# Patient Record
Sex: Male | Born: 1990
Health system: Southern US, Community
[De-identification: ages and names within clinical notes are randomized; demographics above are authoritative.]

## PROBLEM LIST (undated history)

## (undated) DIAGNOSIS — I10 Essential (primary) hypertension: Secondary | ICD-10-CM

## (undated) DIAGNOSIS — J45909 Unspecified asthma, uncomplicated: Secondary | ICD-10-CM

## (undated) DIAGNOSIS — G43909 Migraine, unspecified, not intractable, without status migrainosus: Secondary | ICD-10-CM

## (undated) HISTORY — DX: Migraine, unspecified, not intractable, without status migrainosus: G43.909

## (undated) HISTORY — DX: Unspecified asthma, uncomplicated: J45.909

## (undated) HISTORY — PX: NO PAST SURGERIES: SHX2092

---

## 2018-02-09 ENCOUNTER — Telehealth: Payer: Self-pay

## 2018-02-09 ENCOUNTER — Encounter (INDEPENDENT_AMBULATORY_CARE_PROVIDER_SITE_OTHER): Payer: Self-pay

## 2018-02-09 ENCOUNTER — Ambulatory Visit (INDEPENDENT_AMBULATORY_CARE_PROVIDER_SITE_OTHER): Payer: 59 | Admitting: Cardiology

## 2018-02-09 ENCOUNTER — Encounter: Payer: Self-pay | Admitting: Cardiology

## 2018-02-09 DIAGNOSIS — R030 Elevated blood-pressure reading, without diagnosis of hypertension: Secondary | ICD-10-CM | POA: Insufficient documentation

## 2018-02-09 MED ORDER — AMLODIPINE BESYLATE 5 MG PO TABS: 5.0000 mg | ORAL_TABLET | Freq: Every day | ORAL | 3 refills | Status: DC

## 2018-02-09 MED FILL — AMLODIPINE BESYLATE 5 MG TA: 5 | 90 days supply | Qty: 90 | Fill #0

## 2018-02-09 NOTE — Progress Notes (Signed)
Cardiology Office Note:    Date:  02/09/2018   ID:  Aaron Park, DOB November 30, 1990, MRN [id number]  PCP:  No primary care provider on file.  Cardiologist:  Garwin Brothersajan R Cielo Arias, MD   Referring MD: No ref. provider found    ASSESSMENT:    1. Elevated blood pressure reading without diagnosis of hypertension    PLAN:    In order of problems listed above:  1. The patient's blood pressure is elevated.  He also wants to start an exercise program.  I have mentioned to him about lifestyle modification.  The patient was emphasized about diet and salt intake.  Importance of exercise explained.  At this point, I will initiate him on amlodipine 5 mg daily.  We will monitor his blood pressure.  He works with us in our clinic.  With his blood pressure optimized he can start an exercise program.  Echocardiogram will be done to assess murmur heard on auscultation.  He is overweight and risks of these were explained to him in diet was explained that extensive length.  He vocalized understanding.  He will be seen in follow-up appointment in our clinic on a as needed basis.  I also advised him to get established with a primary care physician.  He had blood work done today including lipids.   Medication Adjustments/Labs and Tests Ordered: Current medicines are reviewed at length with the patient today.  Concerns regarding medicines are outlined above.  No orders of the defined types were placed in this encounter.  No orders of the defined types were placed in this encounter.    History of Present Illness:    Aaron Park is a 27 y.o. male who is being seen today for the evaluation of elevated blood pressure.  This is a pleasant 27 year old male with no significant past medical history.  He works in our clinic.  No chest pain, orthopnea or PND.  He tells me that he has checked his blood pressure over a couple of occasions in the past week or 2 and it is in the range of 150/90.  He is very concerned about  it.  He has not been careful with his diet or exercise and leads a fairly sedentary lifestyle.  He wants to change this to a more active lifestyle.  At the time of my evaluation, the patient is alert awake oriented and in no distress.  History reviewed. No pertinent past medical history.  History reviewed. No pertinent surgical history.  Current Medications: No outpatient medications have been marked as taking for the 02/09/18 encounter (Office Visit) with Hoorain Kozakiewicz, Aundra Dubinajan R, MD.     Allergies:   Patient has no known allergies.   Social History   Socioeconomic History  . Marital status: Single    Spouse name: Not on file  . Number of children: Not on file  . Years of education: Not on file  . Highest education level: Not on file  Occupational History  . Not on file  Social Needs  . Financial resource strain: Not on file  . Food insecurity:    Worry: Not on file    Inability: Not on file  . Transportation needs:    Medical: Not on file    Non-medical: Not on file  Tobacco Use  . Smoking status: Never Smoker  . Smokeless tobacco: Never Used  Substance and Sexual Activity  . Alcohol use: Never    Frequency: Never  . Drug use: Never  . Sexual activity: Not  on file  Lifestyle  . Physical activity:    Days per week: Not on file    Minutes per session: Not on file  . Stress: Not on file  Relationships  . Social connections:    Talks on phone: Not on file    Gets together: Not on file    Attends religious service: Not on file    Active member of club or organization: Not on file    Attends meetings of clubs or organizations: Not on file    Relationship status: Not on file  Other Topics Concern  . Not on file  Social History Narrative  . Not on file     Family History: The patient's family history is not on file.  ROS:   Please see the history of present illness.    All other systems reviewed and are negative.  EKGs/Labs/Other Studies Reviewed:    The following  studies were reviewed today: EKG reveals sinus rhythm and nonspecific ST-T changes   Recent Labs: No results found for requested labs within last 8760 hours.  Recent Lipid Panel No results found for: CHOL, TRIG, HDL, CHOLHDL, VLDL, LDLCALC, LDLDIRECT  Physical Exam:    VS:  BP 140/90 (BP Location: Right Arm, Patient Position: Sitting)   Pulse 80   Ht 5\' 11"  (1.803 m)   Wt 234 lb (106.1 kg)   SpO2 98%   BMI 32.64 kg/m     Wt Readings from Last 3 Encounters:  02/09/18 234 lb (106.1 kg)     GEN: Patient is in no acute distress HEENT: Normal NECK: No JVD; No carotid bruits LYMPHATICS: No lymphadenopathy CARDIAC: S1 S2 regular, 2/6 systolic murmur at the apex. RESPIRATORY:  Clear to auscultation without rales, wheezing or rhonchi  ABDOMEN: Soft, non-tender, non-distended MUSCULOSKELETAL:  No edema; No deformity  SKIN: Warm and dry NEUROLOGIC:  Alert and oriented x 3 PSYCHIATRIC:  Normal affect    Signed, Garwin Brothers, MD  02/09/2018 12:22 PM    [facility]

## 2018-02-09 NOTE — Addendum Note (Signed)
Addended by: Craige CottaANDERSON, ASHLEY S on: 02/09/2018 03:09 PM   Modules accepted: Orders

## 2018-02-09 NOTE — Patient Instructions (Signed)
Medication Instructions:  Your physician has recommended you make the following change in your medication:  START norvasc 5 mg daily  Labwork: Your physician recommends that you have the following labs drawn: TSH, BMP, CBC. Liver and lipid panel.  Testing/Procedures: Your physician has requested that you have an echocardiogram. Echocardiography is a painless test that uses sound waves to create images of your heart. It provides your doctor with information about the size and shape of your heart and how well your heart's chambers and valves are working. This procedure takes approximately one hour. There are no restrictions for this procedure.  Follow-Up: Your physician recommends that you schedule a follow-up appointment in: 1 year  Any Other Special Instructions Will Be Listed Below (If Applicable).     If you need a refill on your cardiac medications before your next appointment, please call your pharmacy.   [facility]  Garey HamAshley A, RN, BSN  Amlodipine tablets What is this medicine? AMLODIPINE (am LOE di peen) is a calcium-channel blocker. It affects the amount of calcium found in your heart and muscle cells. This relaxes your blood vessels, which can reduce the amount of work the heart has to do. This medicine is used to lower high blood pressure. It is also used to prevent chest pain. This medicine may be used for other purposes; ask your health care provider or pharmacist if you have questions. COMMON BRAND NAME(S): Norvasc What should I tell my health care provider before I take this medicine? They need to know if you have any of these conditions: -heart problems like heart failure or aortic stenosis -liver disease -an unusual or allergic reaction to amlodipine, other medicines, foods, dyes, or preservatives -pregnant or trying to get pregnant -breast-feeding How should I use this medicine? Take this medicine by mouth with a glass of water. Follow the directions on the  prescription label. Take your medicine at regular intervals. Do not take more medicine than directed. Talk to your pediatrician regarding the use of this medicine in children. Special care may be needed. This medicine has been used in children as young as 6. Persons over 26 years old may have a stronger reaction to this medicine and need smaller doses. Overdosage: If you think you have taken too much of this medicine contact a poison control center or emergency room at once. NOTE: This medicine is only for you. Do not share this medicine with others. What if I miss a dose? If you miss a dose, take it as soon as you can. If it is almost time for your next dose, take only that dose. Do not take double or extra doses. What may interact with this medicine? -herbal or dietary supplements -local or general anesthetics -medicines for high blood pressure -medicines for prostate problems -rifampin This list may not describe all possible interactions. Give your health care provider a list of all the medicines, herbs, non-prescription drugs, or dietary supplements you use. Also tell them if you smoke, drink alcohol, or use illegal drugs. Some items may interact with your medicine. What should I watch for while using this medicine? Visit your doctor or health care professional for regular check ups. Check your blood pressure and pulse rate regularly. Ask your health care professional what your blood pressure and pulse rate should be, and when you should contact him or her. This medicine may make you feel confused, dizzy or lightheaded. Do not drive, use machinery, or do anything that needs mental alertness until you know  how this medicine affects you. To reduce the risk of dizzy or fainting spells, do not sit or stand up quickly, especially if you are an older patient. Avoid alcoholic drinks; they can make you more dizzy. Do not suddenly stop taking amlodipine. Ask your doctor or health care professional how  you can gradually reduce the dose. What side effects may I notice from receiving this medicine? Side effects that you should report to your doctor or health care professional as soon as possible: -allergic reactions like skin rash, itching or hives, swelling of the face, lips, or tongue -breathing problems -changes in vision or hearing -chest pain -fast, irregular heartbeat -swelling of legs or ankles Side effects that usually do not require medical attention (report to your doctor or health care professional if they continue or are bothersome): -dry mouth -facial flushing -nausea, vomiting -stomach gas, pain -tired, weak -trouble sleeping This list may not describe all possible side effects. Call your doctor for medical advice about side effects. You may report side effects to FDA at 1-800-FDA-1088. Where should I keep my medicine? Keep out of the reach of children. Store at room temperature between 59 and 86 degrees F (15 and 30 degrees C). Protect from light. Keep container tightly closed. Throw away any unused medicine after the expiration date. NOTE: This sheet is a summary. It may not cover all possible information. If you have questions about this medicine, talk to your doctor, pharmacist, or health care provider.  2018 Elsevier/Gold Standard (2012-09-04 11:40:58) Echocardiogram An echocardiogram, or echocardiography, uses sound waves (ultrasound) to produce an image of your heart. The echocardiogram is simple, painless, obtained within a short period of time, and offers valuable information to your health care provider. The images from an echocardiogram can provide information such as:  Evidence of coronary artery disease (CAD).  Heart size.  Heart muscle function.  Heart valve function.  Aneurysm detection.  Evidence of a past heart attack.  Fluid buildup around the heart.  Heart muscle thickening.  Assess heart valve function.  Tell a health care provider  about:  Any allergies you have.  All medicines you are taking, including vitamins, herbs, eye drops, creams, and over-the-counter medicines.  Any problems you or family members have had with anesthetic medicines.  Any blood disorders you have.  Any surgeries you have had.  Any medical conditions you have.  Whether you are pregnant or may be pregnant. What happens before the procedure? No special preparation is needed. Eat and drink normally. What happens during the procedure?  In order to produce an image of your heart, gel will be applied to your chest and a wand-like tool (transducer) will be moved over your chest. The gel will help transmit the sound waves from the transducer. The sound waves will harmlessly bounce off your heart to allow the heart images to be captured in real-time motion. These images will then be recorded.  You may need an IV to receive a medicine that improves the quality of the pictures. What happens after the procedure? You may return to your normal schedule including diet, activities, and medicines, unless your health care provider tells you otherwise. This information is not intended to replace advice given to you by your health care provider. Make sure you discuss any questions you have with your health care provider. Document Released: 10/04/2000 Document Revised: 05/25/2016 Document Reviewed: 06/14/2013 Elsevier Interactive Patient Education  2017 [employer].

## 2018-02-09 NOTE — Telephone Encounter (Signed)
Patient had his blood pressure rechecked this afternoon in the office he is currently at 132/80.

## 2018-02-10 LAB — HEPATIC FUNCTION PANEL
ALT: 121 IU/L — ABNORMAL HIGH (ref 0–44)
AST: 50 IU/L — AB (ref 0–40)
Albumin: 4.2 g/dL (ref 3.5–5.5)
Alkaline Phosphatase: 69 IU/L (ref 39–117)
BILIRUBIN, DIRECT: 0.19 mg/dL (ref 0.00–0.40)
Bilirubin Total: 0.6 mg/dL (ref 0.0–1.2)
Total Protein: 7.7 g/dL (ref 6.0–8.5)

## 2018-02-10 LAB — CBC WITH DIFFERENTIAL/PLATELET
Basophils Absolute: 0 10*3/uL (ref 0.0–0.2)
Basos: 1 %
EOS (ABSOLUTE): 0.2 10*3/uL (ref 0.0–0.4)
EOS: 4 %
HEMATOCRIT: 40.8 % (ref 37.5–51.0)
HEMOGLOBIN: 13.4 g/dL (ref 13.0–17.7)
Immature Grans (Abs): 0 10*3/uL (ref 0.0–0.1)
Immature Granulocytes: 0 %
LYMPHS ABS: 1.8 10*3/uL (ref 0.7–3.1)
Lymphs: 31 %
MCH: 29.2 pg (ref 26.6–33.0)
MCHC: 32.8 g/dL (ref 31.5–35.7)
MCV: 89 fL (ref 79–97)
MONOCYTES: 8 %
MONOS ABS: 0.5 10*3/uL (ref 0.1–0.9)
NEUTROS ABS: 3.3 10*3/uL (ref 1.4–7.0)
Neutrophils: 56 %
Platelets: 298 10*3/uL (ref 150–379)
RBC: 4.59 x10E6/uL (ref 4.14–5.80)
RDW: 13.2 % (ref 12.3–15.4)
WBC: 5.9 10*3/uL (ref 3.4–10.8)

## 2018-02-10 LAB — BASIC METABOLIC PANEL
BUN / CREAT RATIO: 13 (ref 9–20)
BUN: 12 mg/dL (ref 6–20)
CO2: 22 mmol/L (ref 20–29)
CREATININE: 0.9 mg/dL (ref 0.76–1.27)
Calcium: 9.6 mg/dL (ref 8.7–10.2)
Chloride: 101 mmol/L (ref 96–106)
GFR, EST AFRICAN AMERICAN: 136 mL/min/{1.73_m2} (ref >59)
GFR, EST NON AFRICAN AMERICAN: 117 mL/min/{1.73_m2} (ref >59)
Glucose: 76 mg/dL (ref 65–99)
POTASSIUM: 4.2 mmol/L (ref 3.5–5.2)
SODIUM: 141 mmol/L (ref 134–144)

## 2018-02-10 LAB — TSH: TSH: 1.08 u[IU]/mL (ref 0.450–4.500)

## 2018-02-10 LAB — LIPID PANEL
CHOLESTEROL TOTAL: 140 mg/dL (ref 100–199)
Chol/HDL Ratio: 2.8 ratio (ref 0.0–5.0)
HDL: 50 mg/dL (ref >39)
LDL Calculated: 77 mg/dL (ref 0–99)
TRIGLYCERIDES: 67 mg/dL (ref 0–149)
VLDL Cholesterol Cal: 13 mg/dL (ref 5–40)

## 2018-02-13 ENCOUNTER — Ambulatory Visit (INDEPENDENT_AMBULATORY_CARE_PROVIDER_SITE_OTHER): Payer: 59 | Admitting: Family Medicine

## 2018-02-13 ENCOUNTER — Encounter: Payer: Self-pay | Admitting: Family Medicine

## 2018-02-13 VITALS — BP 112/78 | HR 78 | Temp 98.9°F | Ht 71.0 in | Wt 231.0 lb

## 2018-02-13 DIAGNOSIS — R748 Abnormal levels of other serum enzymes: Secondary | ICD-10-CM | POA: Diagnosis not present

## 2018-02-13 DIAGNOSIS — G43009 Migraine without aura, not intractable, without status migrainosus: Secondary | ICD-10-CM

## 2018-02-13 MED ORDER — SUMATRIPTAN SUCCINATE 100 MG PO TABS: 50.0000 mg | ORAL_TABLET | ORAL | 0 refills | Status: DC | PRN

## 2018-02-13 MED ORDER — PROPRANOLOL HCL 20 MG PO TABS: 20.0000 mg | ORAL_TABLET | Freq: Two times a day (BID) | ORAL | 1 refills | Status: DC

## 2018-02-13 MED FILL — PROPRANOLOL 20 MG TABLET: 20 | 30 days supply | Qty: 60 | Fill #0

## 2018-02-13 MED FILL — SUMATRIPTAN SUCC 100 MG TAB: 100 | 30 days supply | Qty: 8 | Fill #0

## 2018-02-13 NOTE — Progress Notes (Signed)
Pre visit review using our clinic review tool, if applicable. No additional management support is needed unless otherwise documented below in the visit note. 

## 2018-02-13 NOTE — Patient Instructions (Signed)
Aim to do some physical exertion for 150 minutes per week. This is typically divided into 5 days per week, 30 minutes per day. The activity should be enough to get your heart rate up. Anything is better than nothing if you have time constraints.  Healthy Eating Plan Many factors influence your heart health, including eating and exercise habits. Heart (coronary) risk increases with abnormal blood fat (lipid) levels. Heart-healthy meal planning includes limiting unhealthy fats, increasing healthy fats, and making other small dietary changes. This includes maintaining a healthy body weight to help keep lipid levels within a normal range.  WHAT IS MY PLAN?  Your health care provider recommends that you:  Drink a glass of water before meals to help with satiety.  Eat slowly.  An alternative to the water is to add Metamucil. This will help with satiety as well. It does contain calories, unlike water.  WHAT TYPES OF FAT SHOULD I CHOOSE?  Choose healthy fats more often. Choose monounsaturated and polyunsaturated fats, such as olive oil and canola oil, flaxseeds, walnuts, almonds, and seeds.  Eat more omega-3 fats. Good choices include salmon, mackerel, sardines, tuna, flaxseed oil, and ground flaxseeds. Aim to eat fish at least two times each week.  Avoid foods with partially hydrogenated oils in them. These contain trans fats. Examples of foods that contain trans fats are stick margarine, some tub margarines, cookies, crackers, and other baked goods. If you are going to avoid a fat, this is the one to avoid!  WHAT GENERAL GUIDELINES DO I NEED TO FOLLOW?  Check food labels carefully to identify foods with trans fats. Avoid these types of options when possible.  Fill one half of your plate with vegetables and green salads. Eat 4-5 servings of vegetables per day. A serving of vegetables equals 1 cup of raw leafy vegetables,  cup of raw or cooked cut-up vegetables, or  cup of vegetable  juice.  Fill one fourth of your plate with whole grains. Look for the word "whole" as the first word in the ingredient list.  Fill one fourth of your plate with lean protein foods.  Eat 4-5 servings of fruit per day. A serving of fruit equals one medium whole fruit,  cup of dried fruit,  cup of fresh, frozen, or canned fruit. Try to avoid fruits in cups/syrups as the sugar content can be high.  Eat more foods that contain soluble fiber. Examples of foods that contain this type of fiber are apples, broccoli, carrots, beans, peas, and barley. Aim to get 20-30 g of fiber per day.  Eat more home-cooked food and less restaurant, buffet, and fast food.  Limit or avoid alcohol.  Limit foods that are high in starch and sugar.  Avoid fried foods when able.  Cook foods by using methods other than frying. Baking, boiling, grilling, and broiling are all great options. Other fat-reducing suggestions include: ? Removing the skin from poultry. ? Removing all visible fats from meats. ? Skimming the fat off of stews, soups, and gravies before serving them. ? Steaming vegetables in water or broth.  Lose weight if you are overweight. Losing just 5-10% of your initial body weight can help your overall health and prevent diseases such as diabetes and heart disease.  Increase your consumption of nuts, legumes, and seeds to 4-5 servings per week. One serving of dried beans or legumes equals  cup after being cooked, one serving of nuts equals 1 ounces, and one serving of seeds equals  ounce or   1 tablespoon.  WHAT ARE GOOD FOODS CAN I EAT? Grains Grainy breads (try to find bread that is 3 g of fiber per slice or greater), oatmeal, light popcorn. Whole-grain cereals. Rice and pasta, including brown rice and those that are made with whole wheat. Edamame pasta is a great alternative to grain pasta. It has a higher protein content. Try to avoid significant consumption of white bread, sugary cereals, or  pastries/baked goods.  Vegetables All vegetables. Cooked white potatoes do not count as vegetables.  Fruits All fruits, but limit pineapple and bananas as these fruits have a higher sugar content.  Meats and Other Protein Sources Lean, well-trimmed beef, veal, pork, and lamb. Chicken and turkey without skin. All fish and shellfish. Wild duck, rabbit, pheasant, and venison. Egg whites or low-cholesterol egg substitutes. Dried beans, peas, lentils, and tofu.Seeds and most nuts.  Dairy Low-fat or nonfat cheeses, including ricotta, string, and mozzarella. Skim or 1% milk that is liquid, powdered, or evaporated. Buttermilk that is made with low-fat milk. Nonfat or low-fat yogurt. Soy/Almond milk are good alternatives if you cannot handle dairy.  Beverages Water is the best for you. Sports drinks with less sugar are more desirable unless you are a highly active athlete.  Sweets and Desserts Sherbets and fruit ices. Honey, jam, marmalade, jelly, and syrups. Dark chocolate.  Eat all sweets and desserts in moderation.  Fats and Oils Nonhydrogenated (trans-free) margarines. Vegetable oils, including soybean, sesame, sunflower, olive, peanut, safflower, corn, canola, and cottonseed. Salad dressings or mayonnaise that are made with a vegetable oil. Limit added fats and oils that you use for cooking, baking, salads, and as spreads.  Other Cocoa powder. Coffee and tea. Most condiments.  The items listed above may not be a complete list of recommended foods or beverages. Contact your dietitian for more options.   

## 2018-02-13 NOTE — Progress Notes (Signed)
Chief Complaint  Patient presents with  . Establish Care       New Patient Visit SUBJECTIVE: HPI: Aaron Park is an 27 y.o.male who is being seen for establishing care.   Hx of migraines. Uses Excedrin for R sided headaches. No aura. Has had migraines for around 1 yr. Got better once he stopped consuming caffeine. Denies vision changes, trouble swallowing, difficulty w speech, numbness, tingling or weakness. He will sometimes have nausea/vomiting when his headaches come about.   Saw cardiology team earlier in the week. Placed on BP meds and labs checked. Elevated LFT's. He believes it could be related to copious use of Excedrin. His diet has been poor over the past 2 years and he has gained around 40 lbs. He is not very physically active. He does not drink alcohol. No IV drug use.   No Known Allergies  Past Medical History:  Diagnosis Date  . Asthma   . Migraine    Past Surgical History:  Procedure Laterality Date  . NO PAST SURGERIES      Family History  Problem Relation Age of Onset  . Diabetes Father      Current Outpatient Medications:  .  amLODipine (NORVASC) 5 MG tablet, Take 1 tablet (5 mg total) by mouth daily., Disp: 90 tablet, Rfl: 3 .  propranolol (INDERAL) 20 MG tablet, Take 1 tablet (20 mg total) by mouth 2 (two) times daily., Disp: 60 tablet, Rfl: 1 .  SUMAtriptan (IMITREX) 100 MG tablet, Take 0.5-1 tablets (50-100 mg total) by mouth every 2 (two) hours as needed for migraine. May repeat in 2 hrs if headache persists/recurs., Disp: 10 tablet, Rfl: 0  ROS Neuro: As noted in HPI  GI: Denies current nausea/vomiting   OBJECTIVE: BP 112/78 (BP Location: Left Arm, Patient Position: Sitting, Cuff Size: Large)   Pulse 78   Temp 98.9 F (37.2 C) (Oral)   Ht 5\' 11"  (1.803 m)   Wt 231 lb (104.8 kg)   SpO2 98%   BMI 32.22 kg/m   Constitutional: -  VS reviewed -  Well developed, well nourished, appears stated age -  No apparent distress  Psychiatric: -   Oriented to person, place, and time -  Memory intact -  Affect and mood normal -  Fluent conversation, good eye contact -  Judgment and insight age appropriate  Eye: -  Conjunctivae clear, no discharge -  Pupils symmetric, round, reactive to light  ENMT: -  MMM    Pharynx moist, no exudate, no erythema  Neck: -  No gross swelling, no palpable masses -  Thyroid midline, not enlarged, mobile, no palpable masses  Cardiovascular: -  RRR  Respiratory: -  Normal respiratory effort, no accessory muscle use, no retraction -  Breath sounds equal, no wheezes, no ronchi, no crackles  Neurological:  -  CN II - XII grossly intact -  DTR's equal and symmetric  Musculoskeletal: -  No clubbing, no cyanosis -  Gait normal -  5/5 strength throughout  Skin: -  No significant lesion on inspection -  Warm and dry to palpation   ASSESSMENT/PLAN: Migraine without aura and without status migrainosus, not intractable - Plan: SUMAtriptan (IMITREX) 100 MG tablet, propranolol (INDERAL) 20 MG tablet  Elevated liver enzymes - Plan: Hepatic function panel  Low dose propranolol and Imitrex prn. Hold off on Excedrin, could be getting med overuse headaches also. Warned that the next mo could be rough.  Discussed follow up lab testing with pt. Will  hold off on immediate testing and see how things go over next mo with prophylactic med and Imitrex. Could be NAFLD. Counseled on diet and exercise.  Patient should return in 1 mo for recheck of Hep panel, CPE at earliest convenience. The patient voiced understanding and agreement to the plan.   Jayvan Mcshan [place], DO 02/13/18  8:00 AM

## 2018-03-17 ENCOUNTER — Telehealth: Payer: Self-pay | Admitting: Family Medicine

## 2018-03-17 ENCOUNTER — Other Ambulatory Visit (INDEPENDENT_AMBULATORY_CARE_PROVIDER_SITE_OTHER): Payer: 59

## 2018-03-17 DIAGNOSIS — R748 Abnormal levels of other serum enzymes: Secondary | ICD-10-CM | POA: Diagnosis not present

## 2018-03-17 DIAGNOSIS — R7401 Elevation of levels of liver transaminase levels: Secondary | ICD-10-CM

## 2018-03-17 DIAGNOSIS — R740 Nonspecific elevation of levels of transaminase and lactic acid dehydrogenase [LDH]: Principal | ICD-10-CM

## 2018-03-17 LAB — HEPATIC FUNCTION PANEL
ALT: 91 U/L — ABNORMAL HIGH (ref 0–53)
AST: 33 U/L (ref 0–37)
Albumin: 4.2 g/dL (ref 3.5–5.2)
Alkaline Phosphatase: 56 U/L (ref 39–117)
Bilirubin, Direct: 0.1 mg/dL (ref 0.0–0.3)
Total Bilirubin: 0.5 mg/dL (ref 0.2–1.2)
Total Protein: 7.3 g/dL (ref 6.0–8.3)

## 2018-03-17 NOTE — Telephone Encounter (Signed)
F/u testing for ALT elev ordered.

## 2018-03-18 ENCOUNTER — Other Ambulatory Visit: Payer: Self-pay | Admitting: Family Medicine

## 2018-03-18 ENCOUNTER — Other Ambulatory Visit (INDEPENDENT_AMBULATORY_CARE_PROVIDER_SITE_OTHER): Payer: 59

## 2018-03-18 DIAGNOSIS — R740 Nonspecific elevation of levels of transaminase and lactic acid dehydrogenase [LDH]: Secondary | ICD-10-CM | POA: Diagnosis not present

## 2018-03-18 DIAGNOSIS — R7401 Elevation of levels of liver transaminase levels: Secondary | ICD-10-CM

## 2018-03-18 LAB — IBC PANEL
Iron: 92 ug/dL (ref 42–165)
Saturation Ratios: 24.8 % (ref 20.0–50.0)
TRANSFERRIN: 265 mg/dL (ref 212.0–360.0)

## 2018-03-18 LAB — FERRITIN: Ferritin: 47 ng/mL (ref 22.0–322.0)

## 2018-03-19 LAB — HEPATITIS C ANTIBODY
Hepatitis C Ab: NONREACTIVE
SIGNAL TO CUT-OFF: 0.03 (ref <1.00)

## 2018-03-19 LAB — HEPATITIS B SURFACE ANTIGEN: HEP B S AG: NONREACTIVE

## 2018-03-20 ENCOUNTER — Other Ambulatory Visit: Payer: Self-pay | Admitting: Family Medicine

## 2018-03-20 DIAGNOSIS — R7401 Elevation of levels of liver transaminase levels: Secondary | ICD-10-CM

## 2018-03-20 DIAGNOSIS — R740 Nonspecific elevation of levels of transaminase and lactic acid dehydrogenase [LDH]: Principal | ICD-10-CM

## 2018-03-23 ENCOUNTER — Encounter (HOSPITAL_BASED_OUTPATIENT_CLINIC_OR_DEPARTMENT_OTHER): Payer: Self-pay | Admitting: Emergency Medicine

## 2018-03-23 ENCOUNTER — Emergency Department (HOSPITAL_BASED_OUTPATIENT_CLINIC_OR_DEPARTMENT_OTHER): Payer: 59

## 2018-03-23 ENCOUNTER — Other Ambulatory Visit: Payer: Self-pay

## 2018-03-23 ENCOUNTER — Encounter: Payer: Self-pay | Admitting: Cardiology

## 2018-03-23 ENCOUNTER — Emergency Department (HOSPITAL_BASED_OUTPATIENT_CLINIC_OR_DEPARTMENT_OTHER)
Admission: EM | Admit: 2018-03-23 | Discharge: 2018-03-23 | Disposition: A | Discharge status: Home / Self Care | Payer: 59 | Attending: Emergency Medicine | Admitting: Emergency Medicine

## 2018-03-23 ENCOUNTER — Ambulatory Visit (INDEPENDENT_AMBULATORY_CARE_PROVIDER_SITE_OTHER): Payer: 59 | Admitting: Cardiology

## 2018-03-23 VITALS — BP 165/104 | HR 106 | Ht 71.0 in | Wt 231.0 lb

## 2018-03-23 DIAGNOSIS — R079 Chest pain, unspecified: Secondary | ICD-10-CM

## 2018-03-23 DIAGNOSIS — R072 Precordial pain: Secondary | ICD-10-CM | POA: Insufficient documentation

## 2018-03-23 DIAGNOSIS — J45909 Unspecified asthma, uncomplicated: Secondary | ICD-10-CM | POA: Diagnosis not present

## 2018-03-23 DIAGNOSIS — Z79899 Other long term (current) drug therapy: Secondary | ICD-10-CM | POA: Insufficient documentation

## 2018-03-23 HISTORY — DX: Essential (primary) hypertension: I10

## 2018-03-23 LAB — CBC
HEMATOCRIT: 41.9 % (ref 39.0–52.0)
Hemoglobin: 14.1 g/dL (ref 13.0–17.0)
MCH: 29.8 pg (ref 26.0–34.0)
MCHC: 33.7 g/dL (ref 30.0–36.0)
MCV: 88.6 fL (ref 78.0–100.0)
Platelets: 275 10*3/uL (ref 150–400)
RBC: 4.73 MIL/uL (ref 4.22–5.81)
RDW: 12.2 % (ref 11.5–15.5)
WBC: 6.2 10*3/uL (ref 4.0–10.5)

## 2018-03-23 LAB — BASIC METABOLIC PANEL
Anion gap: 9 (ref 5–15)
BUN: 17 mg/dL (ref 6–20)
CALCIUM: 8.9 mg/dL (ref 8.9–10.3)
CO2: 23 mmol/L (ref 22–32)
Chloride: 106 mmol/L (ref 101–111)
Creatinine, Ser: 0.93 mg/dL (ref 0.61–1.24)
GFR calc Af Amer: >60 mL/min (ref >60)
GLUCOSE: 128 mg/dL — AB (ref 65–99)
POTASSIUM: 3.9 mmol/L (ref 3.5–5.1)
Sodium: 138 mmol/L (ref 135–145)

## 2018-03-23 LAB — HEPATIC FUNCTION PANEL
ALK PHOS: 64 U/L (ref 38–126)
ALT: 101 U/L — ABNORMAL HIGH (ref 17–63)
AST: 42 U/L — ABNORMAL HIGH (ref 15–41)
Albumin: 3.9 g/dL (ref 3.5–5.0)
BILIRUBIN TOTAL: 0.5 mg/dL (ref 0.3–1.2)
Bilirubin, Direct: 0.1 mg/dL (ref 0.1–0.5)
Indirect Bilirubin: 0.4 mg/dL (ref 0.3–0.9)
TOTAL PROTEIN: 7.5 g/dL (ref 6.5–8.1)

## 2018-03-23 LAB — LIPASE, BLOOD: LIPASE: 34 U/L (ref 11–51)

## 2018-03-23 LAB — TROPONIN I
Troponin I: <0.03 ng/mL (ref <0.03)
Troponin I: <0.03 ng/mL (ref <0.03)

## 2018-03-23 MED ORDER — NITROGLYCERIN 0.4 MG SL SUBL
0.4000 mg | SUBLINGUAL_TABLET | SUBLINGUAL | Status: DC | PRN
  Administered 2018-03-23 (×2): 0.4 mg via SUBLINGUAL

## 2018-03-23 MED ORDER — ASPIRIN 81 MG PO CHEW
324.0000 mg | CHEWABLE_TABLET | Freq: Once | ORAL | Status: AC
  Administered 2018-03-23: 324 mg via ORAL
  Filled 2018-03-23: qty 4

## 2018-03-23 MED ORDER — MORPHINE SULFATE (PF) 4 MG/ML IV SOLN
4.0000 mg | Freq: Once | INTRAVENOUS | Status: AC
  Administered 2018-03-23: 4 mg via INTRAVENOUS
  Filled 2018-03-23: qty 1

## 2018-03-23 MED ORDER — GI COCKTAIL ~~LOC~~
30.0000 mL | Freq: Once | ORAL | Status: AC
  Administered 2018-03-23: 30 mL via ORAL
  Filled 2018-03-23: qty 30

## 2018-03-23 NOTE — ED Triage Notes (Signed)
Patient states that he was getting out of his car and started to have chest pain. The patient has had 2 SL at the drs office upstairs. The patient is still holding his chest and denies any changes to the chest pain

## 2018-03-23 NOTE — Progress Notes (Signed)
Patient was found kneeled down on the floor of the lab room complaining of chest pain. DOD Dr. Tomie Chinaevankar presented and ordered 2 tablets of SL nitro, EKG was completed, blood pressure and pulse were monitored. Once EKG was reviewed patient was taken to the ED by wheelchair. Patient did not wish for the office to inform a family member.

## 2018-03-23 NOTE — ED Notes (Signed)
Pt states the GI cocktail took his pain away. He is smiling and pain free at this time.

## 2018-03-23 NOTE — Discharge Instructions (Signed)
Your work-up today was overall reassuring.  We suspect either a GI related pain after eating the ribs versus a muscular skeletal pain with the tenderness she had.  Your troponins were negative and your heart score was low.  We feel you are safe for discharge home.  Please follow-up with your cardiologist and your PCP for further management.  If any symptoms change or worsen, please return to the nearest emergency department.

## 2018-03-23 NOTE — ED Provider Notes (Signed)
[facility] EMERGENCY DEPARTMENT Provider Note   CSN: [id number] Arrival date & time: 03/23/18  4010     History   Chief Complaint Chief Complaint  Patient presents with  . Chest Pain    HPI Aaron Park is a 27 y.o. male.  The history is provided by medical records and the patient.  Chest Pain   This is a new problem. The current episode started 1 to 2 hours ago. The problem occurs constantly. The problem has not changed since onset.The pain is present in the substernal region. The pain is moderate. The quality of the pain is described as sharp and dull. The pain does not radiate. The symptoms are aggravated by certain positions. Pertinent negatives include no abdominal pain, no back pain, no cough, no diaphoresis, no dizziness, no fever, no headaches, no malaise/fatigue, no nausea, no shortness of breath, no sputum production, no vomiting and no weakness. He has tried nitroglycerin for the symptoms. The treatment provided no relief. Risk factors include male gender.    Past Medical History:  Diagnosis Date  . Asthma   . Hypertension   . Migraine     Patient Active Problem List   Diagnosis Date Noted  . Migraine without aura and without status migrainosus, not intractable 02/13/2018  . Elevated blood pressure reading without diagnosis of hypertension 02/09/2018    Past Surgical History:  Procedure Laterality Date  . NO PAST SURGERIES          Home Medications    Prior to Admission medications   Medication Sig Start Date End Date Taking? Authorizing Provider  amLODipine (NORVASC) 5 MG tablet Take 1 tablet (5 mg total) by mouth daily. 02/09/18 05/10/18  Revankar, Aundra Dubin, MD  propranolol (INDERAL) 20 MG tablet Take 1 tablet (20 mg total) by mouth 2 (two) times daily. 02/13/18   Sharlene Dory, DO  SUMAtriptan (IMITREX) 100 MG tablet Take 0.5-1 tablets (50-100 mg total) by mouth every 2 (two) hours as needed for migraine. May repeat in 2 hrs if  headache persists/recurs. 02/13/18   Sharlene Dory, DO    Family History Family History  Problem Relation Age of Onset  . Diabetes Father     Social History Social History   Tobacco Use  . Smoking status: Never Smoker  . Smokeless tobacco: Never Used  Substance Use Topics  . Alcohol use: Never    Frequency: Never  . Drug use: Never     Allergies   Patient has no known allergies.   Review of Systems Review of Systems  Constitutional: Negative for chills, diaphoresis, fatigue, fever and malaise/fatigue.  HENT: Negative for congestion.   Eyes: Negative for visual disturbance.  Respiratory: Negative for cough, sputum production, chest tightness, shortness of breath, wheezing and stridor.   Cardiovascular: Positive for chest pain.  Gastrointestinal: Negative for abdominal pain, nausea and vomiting.  Genitourinary: Negative for flank pain.  Musculoskeletal: Negative for back pain.  Neurological: Negative for dizziness, weakness, light-headedness and headaches.  Psychiatric/Behavioral: Negative for agitation.  All other systems reviewed and are negative.    Physical Exam Updated Vital Signs BP (!) 136/113   Pulse 70   Temp 98.6 F (37 C)   Resp 17   Ht 5\' 11"  (1.803 m)   Wt 99.8 kg (220 lb)   SpO2 100%   BMI 30.68 kg/m   Physical Exam  Constitutional: He is oriented to person, place, and time. He appears well-developed and well-nourished. No distress.  HENT:  Head: Normocephalic and atraumatic.  Mouth/Throat: Oropharynx is clear and moist.  Eyes: Conjunctivae are normal.  Neck: Neck supple.  Cardiovascular: Normal rate and regular rhythm.  No murmur heard. Pulmonary/Chest: Effort normal and breath sounds normal. No respiratory distress. He has no wheezes. He has no rales. He exhibits no tenderness.  Abdominal: Soft. There is no tenderness.  Musculoskeletal: He exhibits no edema or tenderness.  Neurological: He is alert and oriented to person,  place, and time. No sensory deficit. He exhibits normal muscle tone.  Skin: Skin is warm and dry. Capillary refill takes less than 2 seconds. He is not diaphoretic. No erythema. No pallor.  Psychiatric: He has a normal mood and affect.  Nursing note and vitals reviewed.    ED Treatments / Results  Labs (all labs ordered are listed, but only abnormal results are displayed) Labs Reviewed  BASIC METABOLIC PANEL - Abnormal; Notable for the following components:      Result Value   Glucose, Bld 128 (*)    All other components within normal limits  HEPATIC FUNCTION PANEL - Abnormal; Notable for the following components:   AST 42 (*)    ALT 101 (*)    All other components within normal limits  CBC  TROPONIN I  LIPASE, BLOOD  TROPONIN I    EKG EKG Interpretation  Date/Time:  Monday March 23 2018 08:37:57 EDT Ventricular Rate:  65 PR Interval:    QRS Duration: 81 QT Interval:  386 QTC Calculation: 402 R Axis:   31 Text Interpretation:  Sinus rhythm ST elev, probable normal early repol pattern No prior ECG for comparison.  No STEMI Confirmed by Theda Belfastegeler, Chris ([id number]) on 03/23/2018 8:40:53 AM Also confirmed by Theda Belfastegeler, Chris ([id number]), editor Sheppard EvensSimpson, Miranda [phone])  on 03/23/2018 8:43:41 AM   Radiology Dg Chest 2 View  Result Date: 03/23/2018 CLINICAL DATA:  Chest pain EXAM: CHEST - 2 VIEW COMPARISON:  None. FINDINGS: The heart size and mediastinal contours are within normal limits. Both lungs are clear. The visualized skeletal structures are unremarkable. IMPRESSION: No active cardiopulmonary disease. Electronically Signed   By: Alcide CleverMark  Lukens M.D.   On: 03/23/2018 08:57    Procedures Procedures (including critical care time)  Medications Ordered in ED Medications  aspirin chewable tablet 324 mg (324 mg Oral Given 03/23/18 0855)  gi cocktail (Maalox,Lidocaine,Donnatal) (30 mLs Oral Given 03/23/18 0859)  morphine 4 MG/ML injection 4 mg (4 mg Intravenous Given 03/23/18 0857)     Initial  Impression / Assessment and Plan / ED Course  I have reviewed the triage vital signs and the nursing notes.  Pertinent labs & imaging results that were available during my care of the patient were reviewed by me and considered in my medical decision making (see chart for details).     Aaron Park is a 27 y.o. male with a past medical history significant for hypertension who presents with chest pain.  Patient reports that this morning he began having chest pain it feels like a pressure and punching pain in his central chest.  It does not radiate.  He denies diaphoresis, nausea, vomiting, lightheadedness or palpitations.  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  He describes it as moderate.  He says that he is never had this pain before.  He does state he has had a history of elevated liver enzymes and was told he cannot eat certain foods however he reports that he ate ribs last night.  He is unsure if they are contributing to his symptoms.  He reports no recent trauma and denies other complaints.  He denies back pain, flank pain, shortness of breath, or syncope.  On exam, mild systolic murmur appreciated.  Lungs clear.  Chest did have some tenderness to palpation reproducing his pain.  Abdomen was nontender.  Patient had normal pulses in his upper and lower extremities.  No significant edema seen.  No report of leg pain or swelling.  EKG did not show STEMI or's significant arrhythmia.  Heart score calculated as a 3.    Patient will have screening laboratory testing including a delta troponin to further evaluate.  Patient received nitroglycerin upstairs which did not help his pain.  Patient was given a GI cocktail given the possible reflux/GI cause.  Also considering muscular skeletal pain given the tenderness to palpation.  Anticipate reassessment after work-up.  Patient reports his symptoms completely resolved.  No further chest pain.  Delta troponin was  negative.  Laboratory testing otherwise reassuring.  Chest x-ray reassuring.  EKG did not show STEMI.  Based on patient's low heart score and negative troponins, patient was felt to have a low risk of major adverse cardiac event and was safe for discharge home.  Patient will follow-up with his cardiologist upstairs in several days and understood return precautions.  Patient advised to avoid foods such as the ribs which may have precipitated his symptoms last night.  Suspect a GI cause as the GI cocktail significantly help.  Next  Patient understood return precautions and follow-up instructions.  Patient discharged in good condition.   Final Clinical Impressions(s) / ED Diagnoses   Final diagnoses:  Chest pain, unspecified type    ED Discharge Orders    None      Clinical Impression: 1. Chest pain, unspecified type     Disposition: Discharge  Condition: Good  I have discussed the results, Dx and Tx plan with the pt(& family if present). He/she/they expressed understanding and agree(s) with the plan. Discharge instructions discussed at great length. Strict return precautions discussed and pt &/or family have verbalized understanding of the instructions. No further questions at time of discharge.    New Prescriptions   No medications on file    Follow Up: Matilde, Markie, DO [address] [place] [address] [phone]     [facility] [facility] [address] [id number] YQ [facility] [place] [address] [phone]       Tegeler, Canary Brim, MD 03/23/18 [phone]

## 2018-04-06 MED FILL — PROPRANOLOL 20 MG TABLET: 20 | 30 days supply | Qty: 60 | Fill #1

## 2018-06-05 ENCOUNTER — Telehealth: Payer: Self-pay | Admitting: Family Medicine

## 2018-06-05 ENCOUNTER — Other Ambulatory Visit: Payer: Self-pay | Admitting: Family Medicine

## 2018-06-05 DIAGNOSIS — G43009 Migraine without aura, not intractable, without status migrainosus: Secondary | ICD-10-CM

## 2018-06-05 MED FILL — PROPRANOLOL 20 MG TABLET: 20 | 30 days supply | Qty: 60 | Fill #0

## 2018-06-05 NOTE — Telephone Encounter (Signed)
Copied from CRM [id number]. Topic: Quick Communication - See Telephone Encounter >> Jun 05, 2018  7:51 AM Valentina LucksMatos, Jackelin wrote: CRM for notification. See Telephone encou   Error

## 2018-06-23 ENCOUNTER — Other Ambulatory Visit (INDEPENDENT_AMBULATORY_CARE_PROVIDER_SITE_OTHER): Payer: 59

## 2018-06-23 DIAGNOSIS — R740 Nonspecific elevation of levels of transaminase and lactic acid dehydrogenase [LDH]: Secondary | ICD-10-CM

## 2018-06-23 DIAGNOSIS — R7401 Elevation of levels of liver transaminase levels: Secondary | ICD-10-CM

## 2018-06-23 LAB — HEPATIC FUNCTION PANEL
ALT: 79 U/L — AB (ref 0–53)
AST: 29 U/L (ref 0–37)
Albumin: 4.2 g/dL (ref 3.5–5.2)
Alkaline Phosphatase: 56 U/L (ref 39–117)
BILIRUBIN TOTAL: 0.7 mg/dL (ref 0.2–1.2)
Bilirubin, Direct: 0.2 mg/dL (ref 0.0–0.3)
Total Protein: 7.1 g/dL (ref 6.0–8.3)

## 2018-07-01 ENCOUNTER — Ambulatory Visit (INDEPENDENT_AMBULATORY_CARE_PROVIDER_SITE_OTHER): Payer: 59 | Admitting: Family Medicine

## 2018-07-01 ENCOUNTER — Encounter: Payer: Self-pay | Admitting: Family Medicine

## 2018-07-01 ENCOUNTER — Other Ambulatory Visit: Payer: Self-pay

## 2018-07-01 VITALS — BP 132/82 | HR 100 | Temp 98.7°F | Resp 20 | Ht 71.0 in | Wt 234.0 lb

## 2018-07-01 DIAGNOSIS — R000 Tachycardia, unspecified: Secondary | ICD-10-CM | POA: Diagnosis not present

## 2018-07-01 NOTE — Patient Instructions (Signed)
Hopefully this was because of the diarrhea.  Keep an eye on your heart rate. If it does not improve, send me a message and we can order some labs.  Stay hydrated, keep active.   Let us know if you need anything.

## 2018-07-01 NOTE — Progress Notes (Signed)
Chief Complaint  Patient presents with  . Irregular Heart Beat    HA, tachycardia X 2 days    Aaron Park is a 27 y.o. male here for elevated heart rate.  Length of issue: 3 d Had diarrhea just prior, no current issues. Light headedness/passing out? No Chest pain/shortness of breath? No Hx of arrhythmia? No Medication changes/illicit substances? No  ROS: Cardiac: As noted in HPI Pulm: No SOB  Past Medical History:  Diagnosis Date  . Asthma   . Hypertension   . Migraine    Past Surgical History:  Procedure Laterality Date  . NO PAST SURGERIES     No Known Allergies   BP 132/82 (BP Location: Right Arm, Patient Position: Sitting, Cuff Size: Normal)   Pulse 100   Temp 98.7 F (37.1 C)   Resp 20   Wt 234 lb (106.1 kg)   SpO2 97%   BMI 32.64 kg/m  Gen: Awake, alert, appearing stated age Eyes: PERRLA Mouth: MMM Heart: RRR (rate around 84) , no bruits, no LE edema Lungs: CTAB, no accessory muscle use Abd: Soft, BS+, NT, ND MSK: No muscle group atrophy or asymmetry Psych: Age appropriate judgment and insight, mood/affect WNL  Tachycardia  As this is improving and he has high deductible, I would like to wait on pursuing workup. If not, he will let us know and will order CBC, CMP and TSH. Given hx, low concern for arrhythmia. F/u prn.  The patient voiced understanding and agreement to the plan.  Aaron Park 3:06 PM 07/01/18

## 2018-08-04 ENCOUNTER — Telehealth: Payer: Self-pay | Admitting: Family Medicine

## 2018-08-04 MED ORDER — PROPRANOLOL HCL ER 60 MG PO CP24: 60.0000 mg | ORAL_CAPSULE | Freq: Every day | ORAL | 2 refills | Status: DC

## 2018-08-04 NOTE — Telephone Encounter (Signed)
I will call in a higher dose and rec he follow up in around 1 mo. TY.

## 2018-08-04 NOTE — Telephone Encounter (Signed)
Pt states he needs an increase in his propranolol. Do he need an ov for this?

## 2018-08-05 NOTE — Telephone Encounter (Signed)
Patient aware//scheduled followup appt.

## 2018-08-07 ENCOUNTER — Telehealth: Payer: Self-pay | Admitting: Family Medicine

## 2018-08-07 MED ORDER — PROPRANOLOL HCL ER 60 MG PO CP24: 60.0000 mg | ORAL_CAPSULE | Freq: Every day | ORAL | 2 refills | Status: DC

## 2018-08-07 MED FILL — PROPRANOLOL ER 60 MG CAP: 60 | 30 days supply | Qty: 30 | Fill #0

## 2018-08-07 NOTE — Telephone Encounter (Signed)
done

## 2018-08-07 NOTE — Telephone Encounter (Signed)
Caller name: Relation to pt: Call back [phone] Pharmacy:[facility]  Reason for call: pt states medication was sent to wrong pharmacy, please send to [facility] propranolol ER (INDERAL LA) 60 MG 24 hr capsule

## 2018-09-07 ENCOUNTER — Ambulatory Visit: Payer: 59 | Admitting: Family Medicine

## 2018-09-27 ENCOUNTER — Encounter (HOSPITAL_BASED_OUTPATIENT_CLINIC_OR_DEPARTMENT_OTHER): Payer: Self-pay | Admitting: Emergency Medicine

## 2018-09-27 ENCOUNTER — Emergency Department (HOSPITAL_BASED_OUTPATIENT_CLINIC_OR_DEPARTMENT_OTHER): Payer: 59

## 2018-09-27 ENCOUNTER — Emergency Department (HOSPITAL_BASED_OUTPATIENT_CLINIC_OR_DEPARTMENT_OTHER)
Admission: EM | Admit: 2018-09-27 | Discharge: 2018-09-27 | Disposition: A | Discharge status: Home / Self Care | Payer: 59 | Attending: Emergency Medicine | Admitting: Emergency Medicine

## 2018-09-27 ENCOUNTER — Other Ambulatory Visit: Payer: Self-pay

## 2018-09-27 DIAGNOSIS — R69 Illness, unspecified: Secondary | ICD-10-CM

## 2018-09-27 DIAGNOSIS — J111 Influenza due to unidentified influenza virus with other respiratory manifestations: Secondary | ICD-10-CM | POA: Diagnosis not present

## 2018-09-27 DIAGNOSIS — J45909 Unspecified asthma, uncomplicated: Secondary | ICD-10-CM | POA: Diagnosis not present

## 2018-09-27 DIAGNOSIS — Z79899 Other long term (current) drug therapy: Secondary | ICD-10-CM | POA: Insufficient documentation

## 2018-09-27 DIAGNOSIS — I10 Essential (primary) hypertension: Secondary | ICD-10-CM | POA: Diagnosis not present

## 2018-09-27 DIAGNOSIS — R05 Cough: Secondary | ICD-10-CM | POA: Diagnosis not present

## 2018-09-27 LAB — GROUP A STREP BY PCR: Group A Strep by PCR: NOT DETECTED

## 2018-09-27 MED ORDER — FLUTICASONE PROPIONATE 50 MCG/ACT NA SUSP: 1.0000 | Freq: Every day | NASAL | 0 refills | Status: DC

## 2018-09-27 MED ORDER — ACETAMINOPHEN 325 MG PO TABS
650.0000 mg | ORAL_TABLET | Freq: Once | ORAL | Status: AC
  Administered 2018-09-27: 650 mg via ORAL
  Filled 2018-09-27: qty 2

## 2018-09-27 MED ORDER — OSELTAMIVIR PHOSPHATE 75 MG PO CAPS: 75.0000 mg | ORAL_CAPSULE | Freq: Two times a day (BID) | ORAL | 0 refills | Status: DC

## 2018-09-27 MED ORDER — BENZONATATE 100 MG PO CAPS: 100.0000 mg | ORAL_CAPSULE | Freq: Three times a day (TID) | ORAL | 0 refills | Status: DC

## 2018-09-27 MED ORDER — SODIUM CHLORIDE 0.9 % IV BOLUS
1000.0000 mL | Freq: Once | INTRAVENOUS | Status: AC
  Administered 2018-09-27: 1000 mL via INTRAVENOUS

## 2018-09-27 MED ORDER — IBUPROFEN 800 MG PO TABS
800.0000 mg | ORAL_TABLET | Freq: Once | ORAL | Status: AC
  Administered 2018-09-27: 800 mg via ORAL
  Filled 2018-09-27: qty 1

## 2018-09-27 MED ORDER — IBUPROFEN 800 MG PO TABS: 800.0000 mg | ORAL_TABLET | Freq: Three times a day (TID) | ORAL | 0 refills | Status: DC | PRN

## 2018-09-27 NOTE — ED Triage Notes (Signed)
Pt c/o flu like symptoms for the past 2 days getting worse today with fever, and body ache.

## 2018-09-27 NOTE — Discharge Instructions (Addendum)
You were seen in the emergency today for flu like symptoms. Your chest xray is negative. Strep is negative. Flu swab pending- check my chart to see if positive, if positive you may proceed with tamiflu.  Additional supportive medicines:  -Flonase to be used 1 spray in each nostril daily.  This medication is used to treat your congestion.  -Tessalon can be taken once every 8 hours as needed.  This medication is used to treat your cough.  -Ibuprofen to be taken once every 8 hours as needed for pain. Please take this medicine with food as it can cause stomach upset and at worst stomach bleeding. Do not take other NSAIDs such as motrin, aleve, advil, naproxen, mobic, etc as they are similar. You make take tylenol per over the counter dosing with this medicine safely.  We have prescribed you new medication(s) today. Discuss the medications prescribed today with your pharmacist as they can have adverse effects and interactions with your other medicines including over the counter and prescribed medications. Seek medical evaluation if you start to experience new or abnormal symptoms after taking one of these medicines, seek care immediately if you start to experience difficulty breathing, feeling of your throat closing, facial swelling, or rash as these could be indications of a more serious allergic reaction  You will need to follow-up with your primary care provider in 1 week if your symptoms have not improved.  If you do not have a primary care provider one is provided in your discharge instructions.  Return to the emergency department for any new or worsening symptoms including but not limited to persistent fever for 5 days, difficulty breathing, chest pain, rashes, passing out, or any other concerns.

## 2018-09-27 NOTE — ED Notes (Signed)
Patient verbalizes understanding of discharge instructions. Opportunity for questioning and answers were provided. Armband removed by staff, pt discharged from ED.  

## 2018-09-27 NOTE — ED Notes (Signed)
Patient transported to X-ray 

## 2018-09-27 NOTE — ED Provider Notes (Signed)
[facility] EMERGENCY DEPARTMENT Provider Note   CSN: [id number] Arrival date & time: 09/27/18  1541   History   Chief Complaint Chief Complaint  Patient presents with  . Influenza    HPI Aaron Park is a 27 y.o. male with a hx of asthma, HTN, and migraines who presents to the ED with flu like sxs which started within the past 48 hours. Patient reports congestion, productive cough with green mucous sputum, sore throat from coughing, as well as fever, body aches, and chills. Sxs are constant. Tried tylenol without much improvement. States he had some wheezing w/ hx of asthma this AM relieved by inhaler use. Denies vomiting, diarrhea, abdominal pain, chest pain, or dyspnea.   HPI  Past Medical History:  Diagnosis Date  . Asthma   . Hypertension   . Migraine     Patient Active Problem List   Diagnosis Date Noted  . Migraine without aura and without status migrainosus, not intractable 02/13/2018  . Elevated blood pressure reading without diagnosis of hypertension 02/09/2018    Past Surgical History:  Procedure Laterality Date  . NO PAST SURGERIES          Home Medications    Prior to Admission medications   Medication Sig Start Date End Date Taking? Authorizing Provider  propranolol ER (INDERAL LA) 60 MG 24 hr capsule Take 1 capsule (60 mg total) by mouth daily. 08/07/18   Sharlene DoryWendling, Rourke Paul, DO  SUMAtriptan (IMITREX) 100 MG tablet Take 0.5-1 tablets (50-100 mg total) by mouth every 2 (two) hours as needed for migraine. May repeat in 2 hrs if headache persists/recurs. 02/13/18   Sharlene DoryWendling, Cinsere Paul, DO    Family History Family History  Problem Relation Age of Onset  . Diabetes Father     Social History Social History   Tobacco Use  . Smoking status: Never Smoker  . Smokeless tobacco: Never Used  Substance Use Topics  . Alcohol use: Never    Frequency: Never  . Drug use: Never     Allergies   Patient has no known  allergies.   Review of Systems Review of Systems  Constitutional: Positive for chills and fever.  HENT: Positive for congestion, rhinorrhea and sore throat. Negative for ear pain.   Respiratory: Positive for cough and wheezing. Negative for shortness of breath.   Cardiovascular: Negative for chest pain.  Gastrointestinal: Negative for abdominal pain, diarrhea, nausea and vomiting.  Musculoskeletal: Positive for myalgias.  All other systems reviewed and are negative.    Physical Exam Updated Vital Signs BP 123/84 (BP Location: Right Arm)   Pulse (!) 141   Temp (!) 102.8 F (39.3 C) (Oral)   Resp 19   Ht 5\' 11"  (1.803 m)   Wt 107 kg   SpO2 97%   BMI 32.92 kg/m   Physical Exam  Constitutional: He appears well-developed and well-nourished.  Non-toxic appearance. No distress.  HENT:  Head: Normocephalic and atraumatic.  Right Ear: Tympanic membrane, external ear and ear canal normal.  Left Ear: Tympanic membrane, external ear and ear canal normal.  Nose: Mucosal edema present.  Mouth/Throat: Uvula is midline. Posterior oropharyngeal erythema present. No oropharyngeal exudate.  Posterior oropharynx is symmetric appearing. Patient tolerating own secretions without difficulty. No trismus. No drooling. No hot potato voice. No swelling beneath the tongue, submandibular compartment is soft.   Eyes: Pupils are equal, round, and reactive to light. Conjunctivae and EOM are normal. Right eye exhibits no discharge. Left eye exhibits no discharge.  Neck: Neck supple. No neck rigidity. No edema and no erythema present.  Cardiovascular: Regular rhythm. Tachycardia present.  No murmur heard. Pulmonary/Chest: Effort normal and breath sounds normal. No respiratory distress. He has no wheezes. He has no rhonchi. He has no rales.  Respiration even and unlabored  Abdominal: Soft. He exhibits no distension. There is no tenderness.  Lymphadenopathy:    He has no cervical adenopathy.  Neurological:  He is alert.  Clear speech.   Skin: Skin is warm and dry. No rash noted.  Psychiatric: He has a normal mood and affect. His behavior is normal.  Nursing note and vitals reviewed.    ED Treatments / Results  Labs (all labs ordered are listed, but only abnormal results are displayed) Labs Reviewed  GROUP A STREP BY PCR  INFLUENZA PANEL BY PCR (TYPE A & B)    EKG None  Radiology Dg Chest 2 View  Result Date: 09/27/2018 CLINICAL DATA:  Cough and fever for 2 days. EXAM: CHEST - 2 VIEW COMPARISON:  03/23/2018 FINDINGS: The lungs are clear without focal pneumonia, edema, pneumothorax or pleural effusion. The cardiopericardial silhouette is within normal limits for size. The visualized bony structures of the thorax are intact. IMPRESSION: No active cardiopulmonary disease. Electronically Signed   By: Kennith Center M.D.   On: 09/27/2018 18:27    Procedures Procedures (including critical care time)  Medications Ordered in ED Medications  acetaminophen (TYLENOL) tablet 650 mg (650 mg Oral Given 09/27/18 1559)  sodium chloride 0.9 % bolus 1,000 mL (1,000 mLs Intravenous New Bag/Given 09/27/18 1738)  ibuprofen (ADVIL,MOTRIN) tablet 800 mg (800 mg Oral Given 09/27/18 1824)     Initial Impression / Assessment and Plan / ED Course  I have reviewed the triage vital signs and the nursing notes.  Pertinent labs & imaging results that were available during my care of the patient were reviewed by me and considered in my medical decision making (see chart for details).    Patient presents with flu like symptoms within past 48 hours.  Patient is nontoxic appearing, in no apparent distress, initially febrile with likely resultant tachycardia. Exam fairly benign. Lungs CTA, CXR negative for infiltrate- doubt pneumonia. No wheezing or respiratory distress. Strep negative. Exam not consistent with RPA/PTA.  < 7 days, doubt acute bacterial sinusitis.  No evidence of AOM on exam. Temp and HR trending down.  Likely influenza vs. Flu like illness, flu test pending, patient requesting tamiflu- discussed risks/benefits, he would like a prescription for this. We discussed checking my chart to see if positive and to proceed only if positive. Additional supportive tx with Ibuprofen, Flonase, and Tessalon. I discussed results, treatment plan, need for PCP follow-up, and return precautions with the patient. Provided opportunity for questions, patient confirmed understanding and is in agreement with plan.   Vitals:   09/27/18 1555 09/27/18 1817  BP: 123/84 117/73  Pulse: (!) 141 97  Resp: 19 16  Temp: (!) 102.8 F (39.3 C) (!) 100.4 F (38 C)  SpO2: 97% 100%    Final Clinical Impressions(s) / ED Diagnoses   Final diagnoses:  Influenza-like illness    ED Discharge Orders         Ordered    oseltamivir (TAMIFLU) 75 MG capsule  Every 12 hours     09/27/18 1930    fluticasone (FLONASE) 50 MCG/ACT nasal spray  Daily     09/27/18 1930    benzonatate (TESSALON) 100 MG capsule  Every 8 hours  09/27/18 1930    ibuprofen (ADVIL,MOTRIN) 800 MG tablet  Every 8 hours PRN     09/27/18 1930           Cherly Anderson, PA-C 09/27/18 1932    Raeford Razor, MD 10/01/18 [phone]

## 2018-09-28 LAB — INFLUENZA PANEL BY PCR (TYPE A & B)
Influenza A By PCR: NEGATIVE
Influenza B By PCR: POSITIVE — AB

## 2018-10-05 ENCOUNTER — Ambulatory Visit: Payer: 59 | Admitting: Family Medicine

## 2018-10-05 DIAGNOSIS — Z0289 Encounter for other administrative examinations: Secondary | ICD-10-CM

## 2018-10-26 ENCOUNTER — Ambulatory Visit: Payer: 59 | Admitting: Family Medicine

## 2018-10-26 DIAGNOSIS — Z0289 Encounter for other administrative examinations: Secondary | ICD-10-CM

## 2018-12-04 ENCOUNTER — Ambulatory Visit: Payer: 59 | Admitting: Family Medicine

## 2018-12-04 DIAGNOSIS — Z0289 Encounter for other administrative examinations: Secondary | ICD-10-CM

## 2019-05-03 ENCOUNTER — Ambulatory Visit (INDEPENDENT_AMBULATORY_CARE_PROVIDER_SITE_OTHER): Payer: 59 | Admitting: Family Medicine

## 2019-05-03 ENCOUNTER — Other Ambulatory Visit: Payer: Self-pay

## 2019-05-03 ENCOUNTER — Encounter: Payer: Self-pay | Admitting: Family Medicine

## 2019-05-03 VITALS — BP 112/78 | HR 96 | Temp 98.6°F | Ht 71.0 in | Wt 234.0 lb

## 2019-05-03 DIAGNOSIS — G43009 Migraine without aura, not intractable, without status migrainosus: Secondary | ICD-10-CM

## 2019-05-03 MED ORDER — TOPIRAMATE 25 MG PO TABS: ORAL_TABLET | ORAL | 1 refills | Status: DC

## 2019-05-03 MED ORDER — RIZATRIPTAN BENZOATE 10 MG PO TBDP: 10.0000 mg | ORAL_TABLET | ORAL | 3 refills | Status: DC | PRN

## 2019-05-03 MED FILL — RIZATRIPTAN 10 MG ODT: 10 | 30 days supply | Qty: 10 | Fill #0

## 2019-05-03 MED FILL — TOPIRAMATE 25 MG TAB: 25 | 30 days supply | Qty: 120 | Fill #0

## 2019-05-03 NOTE — Progress Notes (Signed)
Chief Complaint  Patient presents with  . Follow-up    medication    Aaron Park is a 28 y.o. male here for evaluation of right headache.  Propranolol OK, tolerating well, no AE's, but still having 10 HA's/mo, interested in Topamax. Sister did very ewll with it. Lost Imitrex rx, but noted it made him very tired despite working. R sided frontal headaches w/o aura. Does not currently have a headache.   Past Medical History:  Diagnosis Date  . Asthma   . Hypertension   . Migraine    Family History  Problem Relation Age of Onset  . Diabetes Father      BP 112/78 (BP Location: Left Arm, Patient Position: Sitting, Cuff Size: Large)   Pulse 96   Temp 98.6 F (37 C) (Oral)   Ht 5\' 11"  (1.803 m)   Wt 234 lb (106.1 kg)   SpO2 99%   BMI 32.64 kg/m  General: awake, alert, appearing stated age Eyes: PERRLA, EOMi Heart: RRR, no murmurs, no bruits Lungs: CTAB, no accessory muscle use Neuro: CN 2-12 intact, no cerebellar signs, DTR's equal and symmetry, no clonus MSK: 5/5 strength throughout, normal gait, no TTP over posterior cervical triangle or paraspinal cervical musculature Psych: Age appropriate judgment and insight, mood and affect normal  Migraine without aura and without status migrainosus, not intractable - Plan: rizatriptan (MAXALT-MLT) 10 MG disintegrating tablet, topiramate (TOPAMAX) 25 MG tablet; stop prev meds, trial these.   Orders as above. Follow up in 4 weeks for CPE and reck. The patient voiced understanding and agreement to the plan.  [name] [name], DO 1:10 PM 05/03/19

## 2019-05-03 NOTE — Patient Instructions (Addendum)
Let me know if there are any cost issues.   You don't need to taper from the propranolol.   Let us know if you need anything.

## 2019-05-21 ENCOUNTER — Ambulatory Visit (INDEPENDENT_AMBULATORY_CARE_PROVIDER_SITE_OTHER): Payer: 59 | Admitting: Family Medicine

## 2019-05-21 ENCOUNTER — Encounter: Payer: Self-pay | Admitting: Family Medicine

## 2019-05-21 ENCOUNTER — Other Ambulatory Visit: Payer: Self-pay

## 2019-05-21 VITALS — BP 118/68 | HR 95 | Temp 99.0°F | Ht 71.0 in | Wt 233.2 lb

## 2019-05-21 DIAGNOSIS — Z0000 Encounter for general adult medical examination without abnormal findings: Secondary | ICD-10-CM | POA: Diagnosis not present

## 2019-05-21 DIAGNOSIS — Z114 Encounter for screening for human immunodeficiency virus [HIV]: Secondary | ICD-10-CM | POA: Diagnosis not present

## 2019-05-21 NOTE — Progress Notes (Signed)
Chief Complaint  Patient presents with  . Annual Exam    Well Male Aaron Park is here for a complete physical.   His last physical was >1 year ago.  Current diet: in general, diet could be better.   Current exercise: none Weight trend: stable Daytime fatigue? No. Seat belt? Yes.    Health maintenance Tetanus- Yes HIV- No  Past Medical History:  Diagnosis Date  . Asthma   . Hypertension   . Migraine      Past Surgical History:  Procedure Laterality Date  . NO PAST SURGERIES      Medications  Current Outpatient Medications on File Prior to Visit  Medication Sig Dispense Refill  . rizatriptan (MAXALT-MLT) 10 MG disintegrating tablet Take 1 tablet (10 mg total) by mouth as needed for migraine. May repeat in 2 hours if needed 10 tablet 3  . topiramate (TOPAMAX) 25 MG tablet Take 1 tab/dayfor 3 days then 1 tab 2x/day for 3 days. Add a pill in AM and then PM over 3 day intervals until taking 2 pills 2x/day. 120 tablet 1   Allergies No Known Allergies  Family History Family History  Problem Relation Age of Onset  . Diabetes Father     Review of Systems: Constitutional: no fevers or chills Eye:  no recent significant change in vision Ear/Nose/Mouth/Throat:  Ears:  no tinnitus or hearing loss Nose/Mouth/Throat:  no complaints of nasal congestion, no sore throat Cardiovascular:  no chest pain, no palpitations Respiratory:  no cough and no shortness of breath Gastrointestinal:  no abdominal pain, no change in bowel habits GU:  Male: negative for dysuria, frequency, and incontinence and negative for prostate symptoms Musculoskeletal/Extremities:  no pain, redness, or swelling of the joints Integumentary (Skin/Breast):  no abnormal skin lesions reported Neurologic:  no headaches, no numbness, tingling Endocrine: No unexpected weight changes Hematologic/Lymphatic:  no night sweats  Exam BP 118/68 (BP Location: Left Arm, Patient Position: Sitting, Cuff Size: Large)    Pulse 95   Temp 99 F (37.2 C) (Oral)   Ht 5\' 11"  (1.803 m)   Wt 233 lb 4 oz (105.8 kg)   SpO2 97%   BMI 32.53 kg/m  General:  well developed, well nourished, in no apparent distress Skin:  no significant moles, warts, or growths Head:  no masses, lesions, or tenderness Eyes:  pupils equal and round, sclera anicteric without injection Ears:  canals without lesions, TMs shiny without retraction, no obvious effusion, no erythema Nose:  nares patent, septum midline, mucosa normal Throat/Pharynx:  lips and gingiva without lesion; tongue and uvula midline; non-inflamed pharynx; no exudates or postnasal drainage Neck: neck supple without adenopathy, thyromegaly, or masses Lungs:  clear to auscultation, breath sounds equal bilaterally, no respiratory distress Cardio:  regular rate and rhythm, no bruits, no LE edema Abdomen:  abdomen soft, nontender; bowel sounds normal; no masses or organomegaly Genital (male): circumcised penis, no lesions or discharge; testes present bilaterally without masses or tenderness Rectal: Deferred Musculoskeletal:  symmetrical muscle groups noted without atrophy or deformity Extremities:  no clubbing, cyanosis, or edema, no deformities, no skin discoloration Neuro:  gait normal; deep tendon reflexes normal and symmetric Psych: well oriented with normal range of affect and appropriate judgment/insight  Assessment and Plan  Well adult exam - Plan: Lipid panel, CBC, Comprehensive metabolic panel  Screening for HIV (human immunodeficiency virus) - Plan: HIV Antibody (routine testing w rflx)   Well 28 y.o. male. Counseled on diet and exercise. Self testicular checks rec'd.  Other orders as above. Follow up in 6 mo pending the above workup. The patient voiced understanding and agreement to the plan.  [place], DO 05/21/19 3:08 PM

## 2019-05-21 NOTE — Patient Instructions (Signed)
Give us 2-3 business days to get the results of your labs back.   Keep the diet clean and stay active.  Do monthly self testicular checks in the shower. You are feeling for lumps/bumps that don't belong. If you feel anything like this, let me know!  Let us know if you need anything. 

## 2019-05-24 ENCOUNTER — Other Ambulatory Visit: Payer: 59

## 2019-05-24 ENCOUNTER — Telehealth: Payer: Self-pay | Admitting: Family Medicine

## 2019-05-24 NOTE — Telephone Encounter (Signed)
LM to schedule 6 month f/u appt  °

## 2019-06-04 ENCOUNTER — Encounter: Payer: 59 | Admitting: Family Medicine

## 2019-10-08 DIAGNOSIS — H5213 Myopia, bilateral: Secondary | ICD-10-CM | POA: Insufficient documentation

## 2020-11-14 IMAGING — DX DG CHEST 2V
2 series · 2 of 2 positions shown · non-contrast
Comparison: 03/23/2018

CLINICAL DATA: Cough and fever for 2 days.

EXAM:
CHEST - 2 VIEW

[chest pa]
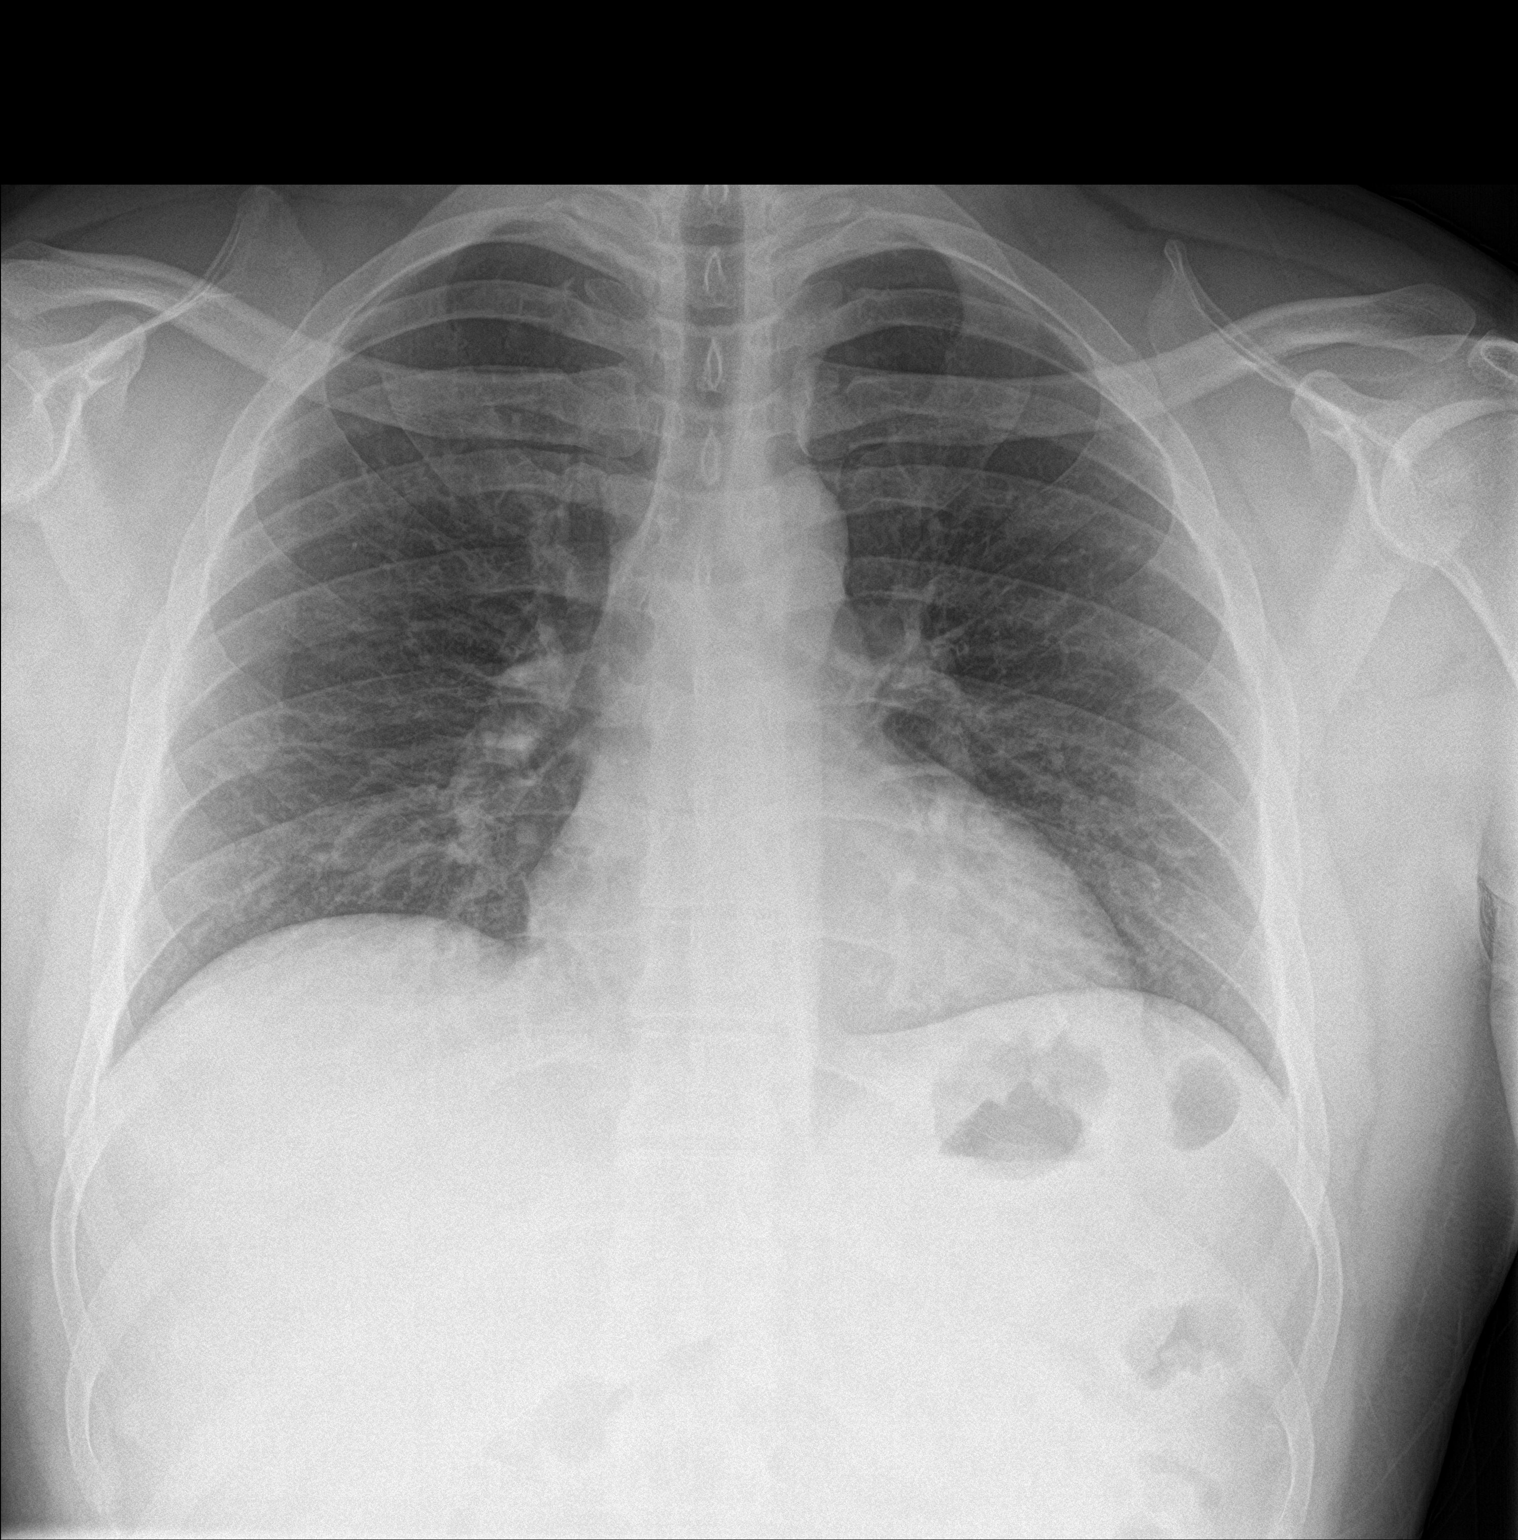

[chest lat]
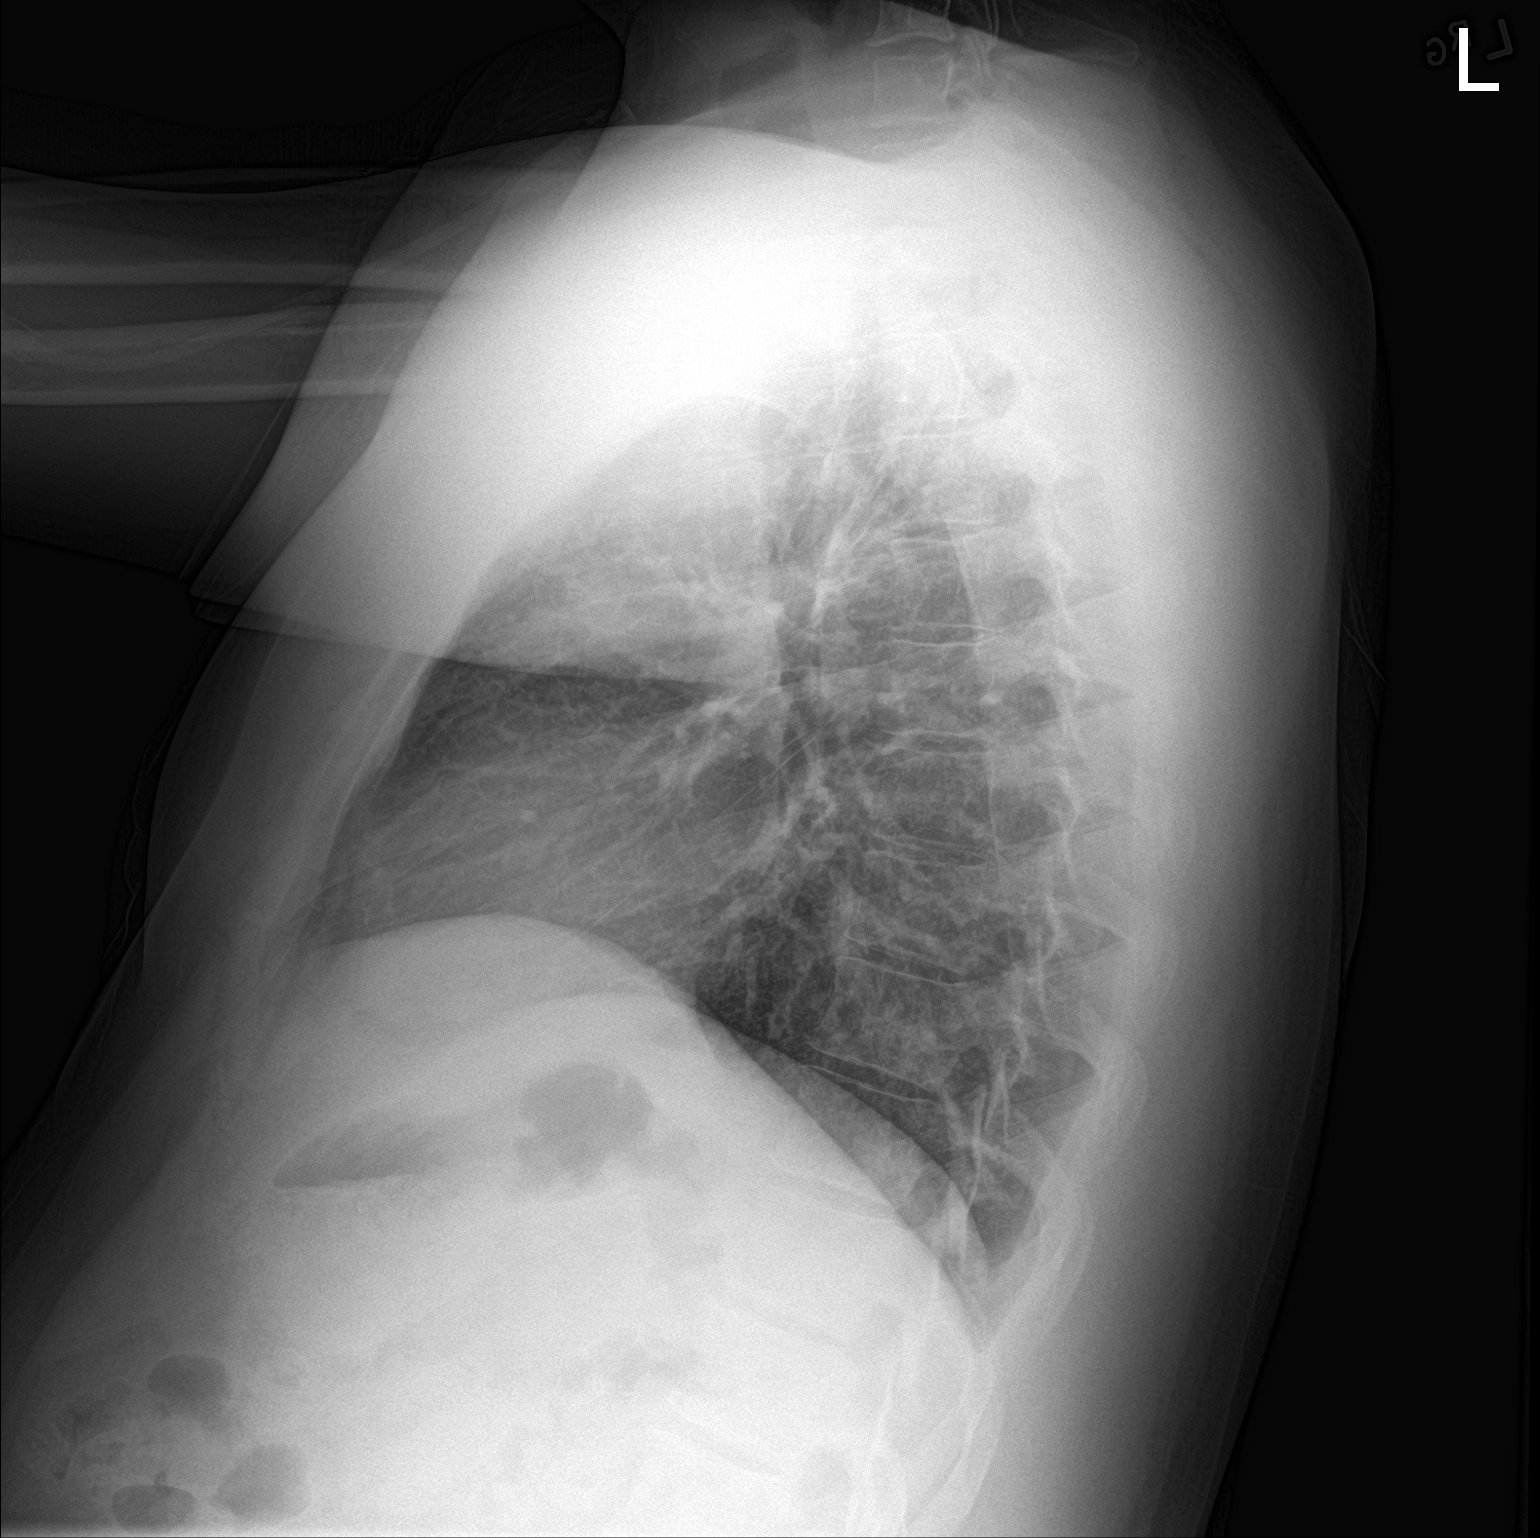

[2 of 2 positions shown; findings below may reference images not displayed]

FINDINGS: The lungs are clear without focal pneumonia, edema, pneumothorax or
pleural effusion. The cardiopericardial silhouette is within normal
limits for size. The visualized bony structures of the thorax are
intact.
IMPRESSION: No active cardiopulmonary disease.

## 2021-02-08 ENCOUNTER — Emergency Department (HOSPITAL_BASED_OUTPATIENT_CLINIC_OR_DEPARTMENT_OTHER): Payer: Self-pay

## 2021-02-08 ENCOUNTER — Emergency Department (HOSPITAL_BASED_OUTPATIENT_CLINIC_OR_DEPARTMENT_OTHER)
Admission: EM | Admit: 2021-02-08 | Discharge: 2021-02-08 | Disposition: A | Discharge status: Home / Self Care | Payer: Self-pay | Attending: Emergency Medicine | Admitting: Emergency Medicine

## 2021-02-08 ENCOUNTER — Encounter (HOSPITAL_BASED_OUTPATIENT_CLINIC_OR_DEPARTMENT_OTHER): Payer: Self-pay | Admitting: Emergency Medicine

## 2021-02-08 ENCOUNTER — Other Ambulatory Visit: Payer: Self-pay

## 2021-02-08 DIAGNOSIS — R5383 Other fatigue: Secondary | ICD-10-CM | POA: Insufficient documentation

## 2021-02-08 DIAGNOSIS — I10 Essential (primary) hypertension: Secondary | ICD-10-CM | POA: Insufficient documentation

## 2021-02-08 DIAGNOSIS — R1031 Right lower quadrant pain: Secondary | ICD-10-CM | POA: Insufficient documentation

## 2021-02-08 DIAGNOSIS — E860 Dehydration: Secondary | ICD-10-CM | POA: Insufficient documentation

## 2021-02-08 DIAGNOSIS — J45909 Unspecified asthma, uncomplicated: Secondary | ICD-10-CM | POA: Insufficient documentation

## 2021-02-08 DIAGNOSIS — R112 Nausea with vomiting, unspecified: Secondary | ICD-10-CM | POA: Insufficient documentation

## 2021-02-08 LAB — CBC WITH DIFFERENTIAL/PLATELET
Abs Immature Granulocytes: 0.08 10*3/uL — ABNORMAL HIGH (ref 0.00–0.07)
Basophils Absolute: 0 10*3/uL (ref 0.0–0.1)
Basophils Relative: 0 %
Eosinophils Absolute: 0 10*3/uL (ref 0.0–0.5)
Eosinophils Relative: 0 %
HCT: 45.5 % (ref 39.0–52.0)
Hemoglobin: 15.7 g/dL (ref 13.0–17.0)
Immature Granulocytes: 1 %
Lymphocytes Relative: 15 %
Lymphs Abs: 1.8 10*3/uL (ref 0.7–4.0)
MCH: 30 pg (ref 26.0–34.0)
MCHC: 34.5 g/dL (ref 30.0–36.0)
MCV: 86.8 fL (ref 80.0–100.0)
Monocytes Absolute: 1 10*3/uL (ref 0.1–1.0)
Monocytes Relative: 9 %
Neutro Abs: 8.5 10*3/uL — ABNORMAL HIGH (ref 1.7–7.7)
Neutrophils Relative %: 75 %
Platelets: 354 10*3/uL (ref 150–400)
RBC: 5.24 MIL/uL (ref 4.22–5.81)
RDW: 12 % (ref 11.5–15.5)
WBC: 11.4 10*3/uL — ABNORMAL HIGH (ref 4.0–10.5)
nRBC: 0 % (ref 0.0–0.2)

## 2021-02-08 LAB — COMPREHENSIVE METABOLIC PANEL
ALT: 60 U/L — ABNORMAL HIGH (ref 0–44)
AST: 27 U/L (ref 15–41)
Albumin: 4.4 g/dL (ref 3.5–5.0)
Alkaline Phosphatase: 59 U/L (ref 38–126)
Anion gap: 13 (ref 5–15)
BUN: 16 mg/dL (ref 6–20)
CO2: 23 mmol/L (ref 22–32)
Calcium: 9.7 mg/dL (ref 8.9–10.3)
Chloride: 100 mmol/L (ref 98–111)
Creatinine, Ser: 1.03 mg/dL (ref 0.61–1.24)
GFR, Estimated: >60 mL/min (ref >60)
Glucose, Bld: 107 mg/dL — ABNORMAL HIGH (ref 70–99)
Potassium: 3.3 mmol/L — ABNORMAL LOW (ref 3.5–5.1)
Sodium: 136 mmol/L (ref 135–145)
Total Bilirubin: 0.9 mg/dL (ref 0.3–1.2)
Total Protein: 8.4 g/dL — ABNORMAL HIGH (ref 6.5–8.1)

## 2021-02-08 LAB — LIPASE, BLOOD: Lipase: 27 U/L (ref 11–51)

## 2021-02-08 MED ORDER — SODIUM CHLORIDE 0.9 % IV BOLUS
1000.0000 mL | Freq: Once | INTRAVENOUS | Status: AC
  Administered 2021-02-08: 1000 mL via INTRAVENOUS

## 2021-02-08 MED ORDER — IOHEXOL 300 MG/ML  SOLN
125.0000 mL | Freq: Once | INTRAMUSCULAR | Status: AC | PRN
  Administered 2021-02-08: 125 mL via INTRAVENOUS

## 2021-02-08 NOTE — Discharge Instructions (Addendum)
You were seen in the emergency department for right lower abdominal pain.  You had some blood work and a CAT scan of your abdomen and pelvis that did not show an obvious explanation of your symptoms.  Consider trying some laxative.  Follow-up with your doctor.  Return to the emergency department if any worsening or concerning symptoms

## 2021-02-08 NOTE — ED Triage Notes (Addendum)
C/o right lower abd pain with n/v  x 5 days

## 2021-02-08 NOTE — ED Notes (Signed)
Patient transported to CT 

## 2021-02-08 NOTE — ED Provider Notes (Signed)
[facility] EMERGENCY DEPARTMENT Provider Note   CSN: [id number] Arrival date & time: 02/08/21  2107     History Chief Complaint  Patient presents with  . Abdominal Pain    Aaron Park is a 30 y.o. male.  He is complaining of right lower quadrant abdominal pain that is been intermittent for the last 5 days.  When the pain comes on he is very nauseous and he vomits.  No blood in the vomitus.  No fevers.  Said he feels very dehydrated.  No urinary symptoms.  No diarrhea.  No prior surgical history.  He said he had a similar episode a few months ago they did not find an answer for it.  The history is provided by the patient.  Abdominal Pain Pain location:  RLQ Pain quality: aching and stabbing   Pain radiates to:  Does not radiate Pain severity:  Moderate Onset quality:  Gradual Timing:  Intermittent Progression:  Unchanged Chronicity:  New Context: not previous surgeries and not trauma   Relieved by:  Nothing Worsened by:  Nothing Ineffective treatments:  Vomiting Associated symptoms: fatigue, nausea and vomiting   Associated symptoms: no chest pain, no cough, no diarrhea, no dysuria, no fever, no hematemesis, no hematochezia, no hematuria, no melena, no shortness of breath and no sore throat        Past Medical History:  Diagnosis Date  . Asthma   . Hypertension   . Migraine     Patient Active Problem List   Diagnosis Date Noted  . Migraine without aura and without status migrainosus, not intractable 02/13/2018  . Elevated blood pressure reading without diagnosis of hypertension 02/09/2018    Past Surgical History:  Procedure Laterality Date  . NO PAST SURGERIES         Family History  Problem Relation Age of Onset  . Diabetes Father     Social History   Tobacco Use  . Smoking status: Never Smoker  . Smokeless tobacco: Never Used  Substance Use Topics  . Alcohol use: Never  . Drug use: Never    Home Medications Prior to Admission  medications   Medication Sig Start Date End Date Taking? Authorizing Provider  rizatriptan (MAXALT-MLT) 10 MG disintegrating tablet Take 1 tablet (10 mg total) by mouth as needed for migraine. May repeat in 2 hours if needed 05/03/19   Sharlene Dory, DO  topiramate (TOPAMAX) 25 MG tablet Take 1 tab/dayfor 3 days then 1 tab 2x/day for 3 days. Add a pill in AM and then PM over 3 day intervals until taking 2 pills 2x/day. 05/03/19   Sharlene Dory, DO    Allergies    Patient has no known allergies.  Review of Systems   Review of Systems  Constitutional: Positive for fatigue. Negative for fever.  HENT: Negative for sore throat.   Eyes: Negative for visual disturbance.  Respiratory: Negative for cough and shortness of breath.   Cardiovascular: Negative for chest pain.  Gastrointestinal: Positive for abdominal pain, nausea and vomiting. Negative for diarrhea, hematemesis, hematochezia and melena.  Genitourinary: Negative for dysuria and hematuria.  Musculoskeletal: Negative for back pain.  Skin: Negative for rash.  Neurological: Positive for headaches.    Physical Exam Updated Vital Signs BP (!) 142/95   Pulse 76   Temp 99.2 F (37.3 C) (Oral)   Resp 16   Ht 5\' 11"  (1.803 m)   Wt 104.3 kg   SpO2 100%   BMI 32.08 kg/m  Physical Exam Vitals and nursing note reviewed.  Constitutional:      Appearance: He is well-developed.  HENT:     Head: Normocephalic and atraumatic.  Eyes:     Conjunctiva/sclera: Conjunctivae normal.  Cardiovascular:     Rate and Rhythm: Normal rate and regular rhythm.     Heart sounds: No murmur heard.   Pulmonary:     Effort: Pulmonary effort is normal. No respiratory distress.     Breath sounds: Normal breath sounds.  Abdominal:     General: Abdomen is flat.     Palpations: Abdomen is soft. There is no pulsatile mass.     Tenderness: There is abdominal tenderness in the right lower quadrant. There is no guarding or rebound.   Musculoskeletal:        General: Normal range of motion.     Cervical back: Neck supple.  Skin:    General: Skin is warm and dry.     Capillary Refill: Capillary refill takes less than 2 seconds.  Neurological:     General: No focal deficit present.     Mental Status: He is alert.     ED Results / Procedures / Treatments   Labs (all labs ordered are listed, but only abnormal results are displayed) Labs Reviewed  CBC WITH DIFFERENTIAL/PLATELET - Abnormal; Notable for the following components:      Result Value   WBC 11.4 (*)    Neutro Abs 8.5 (*)    Abs Immature Granulocytes 0.08 (*)    All other components within normal limits  COMPREHENSIVE METABOLIC PANEL - Abnormal; Notable for the following components:   Potassium 3.3 (*)    Glucose, Bld 107 (*)    Total Protein 8.4 (*)    ALT 60 (*)    All other components within normal limits  LIPASE, BLOOD    EKG None  Radiology CT Abdomen Pelvis W Contrast  Result Date: 02/08/2021 CLINICAL DATA:  Right lower quadrant pain EXAM: CT ABDOMEN AND PELVIS WITH CONTRAST TECHNIQUE: Multidetector CT imaging of the abdomen and pelvis was performed using the standard protocol following bolus administration of intravenous contrast. CONTRAST:  OMNIPAQUE IOHEXOL 300 MG/ML  SOLN COMPARISON:  CT 04/24/2020 FINDINGS: Lower chest: No acute abnormality. Hepatobiliary: Hepatic steatosis. No calcified gallstone or biliary dilatation Pancreas: Unremarkable. No pancreatic ductal dilatation or surrounding inflammatory changes. Spleen: Normal in size without focal abnormality. Adrenals/Urinary Tract: Adrenal glands are unremarkable. Kidneys are normal, without renal calculi, focal lesion, or hydronephrosis. Bladder is unremarkable. Stomach/Bowel: Stomach is within normal limits. Appendix appears normal. No evidence of bowel wall thickening, distention, or inflammatory changes. Vascular/Lymphatic: No significant vascular findings are present. No enlarged  abdominal or pelvic lymph nodes. Reproductive: Prostate is unremarkable. Other: No abdominal wall hernia or abnormality. No abdominopelvic ascites. Musculoskeletal: No acute or significant osseous findings. IMPRESSION: 1. No CT evidence for acute intra-abdominal or pelvic abnormality. 2. Hepatic steatosis. Electronically Signed   By: Jasmine Pang M.D.   On: 02/08/2021 23:08    Procedures Procedures   Medications Ordered in ED Medications  sodium chloride 0.9 % bolus 1,000 mL (has no administration in time range)    ED Course  I have reviewed the triage vital signs and the nursing notes.  Pertinent labs & imaging results that were available during my care of the patient were reviewed by me and considered in my medical decision making (see chart for details).  Clinical Course as of 02/09/21 0944  Thu Feb 08, 2021  2235  LFTs have been mildly elevated in the past. [MB]  2313 CT not showing any obvious explanation for the patient's symptoms.  I reviewed this with him.  He says he feels better after the IV fluids. [MB]    Clinical Course User Index [MB] Terrilee Files, MD   MDM Rules/Calculators/A&P                         This patient complains of right lower quadrant abdominal pain dehydration; this involves an extensive number of treatment Options and is a complaint that carries with it a high risk of complications and Morbidity. The differential includes appendicitis, colitis, diverticulitis, constipation, dehydration, gastroenteritis  I ordered, reviewed and interpreted labs, which included CBC with mildly elevated white count, normal hemoglobin, chemistries fairly normal other than mildly low potassium, ALT elevated seen priors I ordered medication IV fluids I ordered imaging studies which included CT abdomen and pelvis and I independently    visualized and interpreted imaging which showed no acute findings Additional history obtained from patient's brother Previous records  obtained and reviewed in epic, no recent admissions  After the interventions stated above, I reevaluated the patient and found patient to be fairly asymptomatic.  Reviewed results of work-up with him.  He feels better after IV fluids.  Return instructions discussed   Final Clinical Impression(s) / ED Diagnoses Final diagnoses:  RLQ abdominal pain    Rx / DC Orders ED Discharge Orders    None       Terrilee Files, MD 02/09/21 [phone]

## 2021-02-21 ENCOUNTER — Encounter: Payer: Self-pay | Admitting: Family Medicine

## 2021-04-30 ENCOUNTER — Telehealth (HOSPITAL_BASED_OUTPATIENT_CLINIC_OR_DEPARTMENT_OTHER): Payer: Self-pay

## 2021-04-30 ENCOUNTER — Emergency Department (HOSPITAL_BASED_OUTPATIENT_CLINIC_OR_DEPARTMENT_OTHER)
Admission: EM | Admit: 2021-04-30 | Discharge: 2021-04-30 | Disposition: A | Discharge status: Home / Self Care | Payer: Self-pay | Attending: Emergency Medicine | Admitting: Emergency Medicine

## 2021-04-30 ENCOUNTER — Other Ambulatory Visit: Payer: Self-pay

## 2021-04-30 ENCOUNTER — Encounter (HOSPITAL_BASED_OUTPATIENT_CLINIC_OR_DEPARTMENT_OTHER): Payer: Self-pay | Admitting: *Deleted

## 2021-04-30 DIAGNOSIS — U071 COVID-19: Secondary | ICD-10-CM | POA: Insufficient documentation

## 2021-04-30 DIAGNOSIS — J45909 Unspecified asthma, uncomplicated: Secondary | ICD-10-CM | POA: Insufficient documentation

## 2021-04-30 DIAGNOSIS — R112 Nausea with vomiting, unspecified: Secondary | ICD-10-CM

## 2021-04-30 DIAGNOSIS — I10 Essential (primary) hypertension: Secondary | ICD-10-CM | POA: Insufficient documentation

## 2021-04-30 LAB — COMPREHENSIVE METABOLIC PANEL
ALT: 51 U/L — ABNORMAL HIGH (ref 0–44)
AST: 36 U/L (ref 15–41)
Albumin: 4.1 g/dL (ref 3.5–5.0)
Alkaline Phosphatase: 83 U/L (ref 38–126)
Anion gap: 10 (ref 5–15)
BUN: 17 mg/dL (ref 6–20)
CO2: 24 mmol/L (ref 22–32)
Calcium: 9 mg/dL (ref 8.9–10.3)
Chloride: 103 mmol/L (ref 98–111)
Creatinine, Ser: 1.02 mg/dL (ref 0.61–1.24)
GFR, Estimated: >60 mL/min (ref >60)
Glucose, Bld: 129 mg/dL — ABNORMAL HIGH (ref 70–99)
Potassium: 3.5 mmol/L (ref 3.5–5.1)
Sodium: 137 mmol/L (ref 135–145)
Total Bilirubin: 0.4 mg/dL (ref 0.3–1.2)
Total Protein: 8.1 g/dL (ref 6.5–8.1)

## 2021-04-30 LAB — CBC
HCT: 47.1 % (ref 39.0–52.0)
Hemoglobin: 15.7 g/dL (ref 13.0–17.0)
MCH: 29 pg (ref 26.0–34.0)
MCHC: 33.3 g/dL (ref 30.0–36.0)
MCV: 87.1 fL (ref 80.0–100.0)
Platelets: 280 10*3/uL (ref 150–400)
RBC: 5.41 MIL/uL (ref 4.22–5.81)
RDW: 12.4 % (ref 11.5–15.5)
WBC: 3.5 10*3/uL — ABNORMAL LOW (ref 4.0–10.5)
nRBC: 0 % (ref 0.0–0.2)

## 2021-04-30 LAB — URINALYSIS, ROUTINE W REFLEX MICROSCOPIC
Bilirubin Urine: NEGATIVE
Glucose, UA: NEGATIVE mg/dL
Hgb urine dipstick: NEGATIVE
Ketones, ur: NEGATIVE mg/dL
Leukocytes,Ua: NEGATIVE
Nitrite: NEGATIVE
Protein, ur: NEGATIVE mg/dL
Specific Gravity, Urine: 1.025 (ref 1.005–1.030)
pH: 6 (ref 5.0–8.0)

## 2021-04-30 LAB — LIPASE, BLOOD: Lipase: 29 U/L (ref 11–51)

## 2021-04-30 LAB — RESP PANEL BY RT-PCR (FLU A&B, COVID) ARPGX2
Influenza A by PCR: NEGATIVE
Influenza B by PCR: NEGATIVE
SARS Coronavirus 2 by RT PCR: POSITIVE — AB

## 2021-04-30 MED ORDER — ONDANSETRON HCL 4 MG/2ML IJ SOLN
4.0000 mg | Freq: Once | INTRAMUSCULAR | Status: AC | PRN
  Administered 2021-04-30: 4 mg via INTRAVENOUS
  Filled 2021-04-30: qty 2

## 2021-04-30 MED ORDER — ONDANSETRON 8 MG PO TBDP: ORAL_TABLET | ORAL | 0 refills | Status: DC

## 2021-04-30 MED ORDER — ALIGN 4 MG PO CAPS: 1.0000 | ORAL_CAPSULE | Freq: Four times a day (QID) | ORAL | 0 refills | Status: DC

## 2021-04-30 MED ORDER — SODIUM CHLORIDE 0.9 % IV BOLUS
1000.0000 mL | Freq: Once | INTRAVENOUS | Status: AC
  Administered 2021-04-30: 1000 mL via INTRAVENOUS

## 2021-04-30 MED ORDER — DICYCLOMINE HCL 20 MG PO TABS: 20.0000 mg | ORAL_TABLET | Freq: Two times a day (BID) | ORAL | 0 refills | Status: DC

## 2021-04-30 NOTE — ED Provider Notes (Signed)
[facility] EMERGENCY DEPARTMENT Provider Note   CSN: [id number] Arrival date & time: 04/30/21  1510     History Chief Complaint  Patient presents with   Emesis    Aaron Park is a 30 y.o. male.  The history is provided by the patient.  Emesis Severity:  Moderate Duration:  3 days Timing:  Intermittent Number of daily episodes:  5 Quality:  Stomach contents Progression:  Unchanged Chronicity:  New Recent urination:  Normal Context: not post-tussive   Relieved by:  Nothing Worsened by:  Nothing Ineffective treatments:  None tried Associated symptoms: no abdominal pain, no arthralgias, no chills, no cough, no diarrhea, no fever, no headaches, no myalgias, no sore throat and no URI   Risk factors: suspect food intake   Risk factors: no alcohol use   Patient presents with nausea, vomiting and diarrhea since eating [place] a [employer] on Friday night.  No f/c/r.  No pain.      Past Medical History:  Diagnosis Date   Asthma    Hypertension    Migraine     Patient Active Problem List   Diagnosis Date Noted   Migraine without aura and without status migrainosus, not intractable 02/13/2018   Elevated blood pressure reading without diagnosis of hypertension 02/09/2018    Past Surgical History:  Procedure Laterality Date   NO PAST SURGERIES         Family History  Problem Relation Age of Onset   Diabetes Father     Social History   Tobacco Use   Smoking status: Never   Smokeless tobacco: Never  Vaping Use   Vaping Use: Never used  Substance Use Topics   Alcohol use: Never   Drug use: Never    Home Medications Prior to Admission medications   Medication Sig Start Date End Date Taking? Authorizing Provider  rizatriptan (MAXALT-MLT) 10 MG disintegrating tablet Take 1 tablet (10 mg total) by mouth as needed for migraine. May repeat in 2 hours if needed 05/03/19   Sharlene Dory, DO  topiramate (TOPAMAX) 25 MG tablet Take 1 tab/dayfor 3  days then 1 tab 2x/day for 3 days. Add a pill in AM and then PM over 3 day intervals until taking 2 pills 2x/day. 05/03/19   Sharlene Dory, DO    Allergies    Patient has no known allergies.  Review of Systems   Review of Systems  Constitutional:  Negative for chills and fever.  HENT:  Negative for drooling and sore throat.   Eyes:  Negative for redness.  Respiratory:  Negative for cough.   Cardiovascular:  Negative for palpitations.  Gastrointestinal:  Positive for vomiting. Negative for abdominal pain and diarrhea.  Genitourinary:  Negative for difficulty urinating.  Musculoskeletal:  Negative for arthralgias and myalgias.  Skin:  Negative for rash.  Neurological:  Negative for headaches.  Psychiatric/Behavioral:  Negative for agitation.   All other systems reviewed and are negative.  Physical Exam Updated Vital Signs BP 131/84 (BP Location: Right Arm)   Pulse 66   Temp 98.4 F (36.9 C) (Oral)   Resp 16   Ht 5\' 11"  (1.803 m)   Wt 98 kg   SpO2 100%   BMI 30.13 kg/m   Physical Exam Vitals and nursing note reviewed.  Constitutional:      General: He is not in acute distress.    Appearance: Normal appearance.  HENT:     Head: Normocephalic and atraumatic.     Nose:  Nose normal.  Eyes:     Conjunctiva/sclera: Conjunctivae normal.     Pupils: Pupils are equal, round, and reactive to light.  Cardiovascular:     Rate and Rhythm: Normal rate and regular rhythm.     Pulses: Normal pulses.     Heart sounds: Normal heart sounds.  Pulmonary:     Effort: Pulmonary effort is normal.     Breath sounds: Normal breath sounds.  Abdominal:     General: Abdomen is flat. Bowel sounds are normal.     Palpations: Abdomen is soft.     Tenderness: There is no abdominal tenderness. There is no guarding or rebound.     Hernia: No hernia is present.  Musculoskeletal:        General: Normal range of motion.     Cervical back: Normal range of motion and neck supple.  Skin:     General: Skin is warm and dry.     Capillary Refill: Capillary refill takes less than 2 seconds.  Neurological:     General: No focal deficit present.     Mental Status: He is alert and oriented to person, place, and time.     Deep Tendon Reflexes: Reflexes normal.  Psychiatric:        Mood and Affect: Mood normal.        Behavior: Behavior normal.    ED Results / Procedures / Treatments   Labs (all labs ordered are listed, but only abnormal results are displayed) Labs Reviewed  COMPREHENSIVE METABOLIC PANEL - Abnormal; Notable for the following components:      Result Value   Glucose, Bld 129 (*)    ALT 51 (*)    All other components within normal limits  CBC - Abnormal; Notable for the following components:   WBC 3.5 (*)    All other components within normal limits  RESP PANEL BY RT-PCR (FLU A&B, COVID) ARPGX2  LIPASE, BLOOD  URINALYSIS, ROUTINE W REFLEX MICROSCOPIC    EKG None  Radiology No results found.  Procedures Procedures   Medications Ordered in ED Medications  ondansetron (ZOFRAN) injection 4 mg (4 mg Intravenous Given 04/30/21 1543)  sodium chloride 0.9 % bolus 1,000 mL (0 mLs Intravenous Stopped 04/30/21 1627)    ED Course  I have reviewed the triage vital signs and the nursing notes.  Pertinent labs & imaging results that were available during my care of the patient were reviewed by me and considered in my medical decision making (see chart for details).   Well appearing.  Labs remarkable only for slight elevation of ALT.  Have informed patient of need for recheck.  No f/c/r.  Exam and vitals are benign and reassuring.  I do not feel this patient needs imaging at this time.  Strict return precautions given.    Gurvir Schrom was evaluated in Emergency Department on 04/30/2021 for the symptoms described in the history of present illness. He was evaluated in the context of the global COVID-19 pandemic, which necessitated consideration that the patient  might be at risk for infection with the SARS-CoV-2 virus that causes COVID-19. Institutional protocols and algorithms that pertain to the evaluation of patients at risk for COVID-19 are in a state of rapid change based on information released by regulatory bodies including the CDC and federal and state organizations. These policies and algorithms were followed during the patient's care in the ED.  Final Clinical Impression(s) / ED Diagnoses Final diagnoses:  None   Return for intractable cough,  coughing up blood, fevers > 100.4 unrelieved by medication, shortness of breath, intractable vomiting, chest pain, shortness of breath, weakness, numbness, changes in speech, facial asymmetry, abdominal pain, passing out, Inability to tolerate liquids or food, cough, altered mental status or any concerns. No signs of systemic illness or infection. The patient is nontoxic-appearing on exam and vital signs are within normal limits. I have reviewed the triage vital signs and the nursing notes. Pertinent labs & imaging results that were available during my care of the patient were reviewed by me and considered in my medical decision making (see chart for details). After history, exam, and medical workup I feel the patient has been appropriately medically screened and is safe for discharge home. Pertinent diagnoses were discussed with the patient. Patient was given return precautions.  Rx / DC Orders ED Discharge Orders     None        Guneet Delpino, MD 04/30/21 [id number]

## 2021-04-30 NOTE — Telephone Encounter (Signed)
Pt contacted and notified COVID test came back as positive. No changes to current treatment per Dr. Nicanor Alcon. Pt advised to self quarantine and follow up with PCP as previously discussed.

## 2021-04-30 NOTE — ED Notes (Signed)
Pt teaching provided on medications that may cause drowsiness. Pt instructed not to drive or operate heavy machinery while taking the prescribed medication. Pt verbalized understanding.   

## 2021-04-30 NOTE — ED Triage Notes (Signed)
Pt reports he ate at [employer] on Friday and has had n/v/d since then

## 2022-02-03 ENCOUNTER — Encounter (HOSPITAL_BASED_OUTPATIENT_CLINIC_OR_DEPARTMENT_OTHER): Payer: Self-pay | Admitting: Emergency Medicine

## 2022-02-03 ENCOUNTER — Emergency Department (HOSPITAL_BASED_OUTPATIENT_CLINIC_OR_DEPARTMENT_OTHER)
Admission: EM | Admit: 2022-02-03 | Discharge: 2022-02-03 | Disposition: A | Discharge status: Home / Self Care | Payer: PRIVATE HEALTH INSURANCE | Attending: Emergency Medicine | Admitting: Emergency Medicine

## 2022-02-03 DIAGNOSIS — X58XXXA Exposure to other specified factors, initial encounter: Secondary | ICD-10-CM | POA: Insufficient documentation

## 2022-02-03 DIAGNOSIS — S39012A Strain of muscle, fascia and tendon of lower back, initial encounter: Secondary | ICD-10-CM | POA: Diagnosis not present

## 2022-02-03 DIAGNOSIS — S34109A Unspecified injury to unspecified level of lumbar spinal cord, initial encounter: Secondary | ICD-10-CM | POA: Diagnosis present

## 2022-02-03 MED ORDER — CYCLOBENZAPRINE HCL 10 MG PO TABS: 10.0000 mg | ORAL_TABLET | Freq: Every evening | ORAL | 0 refills | Status: DC | PRN

## 2022-02-03 MED ORDER — NAPROXEN 500 MG PO TABS: 500.0000 mg | ORAL_TABLET | Freq: Two times a day (BID) | ORAL | 0 refills | Status: DC | PRN
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
## 2022-02-03 NOTE — Discharge Instructions (Signed)
I am prescribing you an anti-inflammatory medication called naproxen.  This is similar to Aleve.  You can take this up to 2 times a day for management of your pain.  Try to take it with a small amount of food to help prevent stomach irritation. ? ?I am prescribing you a strong muscle relaxer called flexeril. Please only take this medication once in the evening with dinner. This medication can make you quite drowsy. Do not mix it with alcohol. Do not drive a vehicle after taking it.  ? ?If you develop any new or worsening symptoms please do not hesitate to return to the emergency department.  Please follow-up with your primary care provider regarding your symptoms as well as this visit today.  It was a pleasure to meet you. ? ?

## 2022-05-04 ENCOUNTER — Encounter (HOSPITAL_BASED_OUTPATIENT_CLINIC_OR_DEPARTMENT_OTHER): Payer: Self-pay | Admitting: Emergency Medicine

## 2022-05-04 ENCOUNTER — Other Ambulatory Visit: Payer: Self-pay

## 2022-05-04 ENCOUNTER — Emergency Department (HOSPITAL_BASED_OUTPATIENT_CLINIC_OR_DEPARTMENT_OTHER)
Admission: EM | Admit: 2022-05-04 | Discharge: 2022-05-04 | Disposition: A | Discharge status: Home / Self Care | Payer: PRIVATE HEALTH INSURANCE | Attending: Emergency Medicine | Admitting: Emergency Medicine

## 2022-05-04 DIAGNOSIS — R350 Frequency of micturition: Secondary | ICD-10-CM | POA: Diagnosis not present

## 2022-05-04 DIAGNOSIS — R631 Polydipsia: Secondary | ICD-10-CM | POA: Diagnosis present

## 2022-05-04 DIAGNOSIS — E871 Hypo-osmolality and hyponatremia: Secondary | ICD-10-CM | POA: Diagnosis not present

## 2022-05-04 DIAGNOSIS — R112 Nausea with vomiting, unspecified: Secondary | ICD-10-CM | POA: Diagnosis not present

## 2022-05-04 DIAGNOSIS — H538 Other visual disturbances: Secondary | ICD-10-CM | POA: Diagnosis not present

## 2022-05-04 DIAGNOSIS — R3589 Other polyuria: Secondary | ICD-10-CM | POA: Diagnosis not present

## 2022-05-04 DIAGNOSIS — M545 Low back pain, unspecified: Secondary | ICD-10-CM | POA: Insufficient documentation

## 2022-05-04 DIAGNOSIS — R739 Hyperglycemia, unspecified: Secondary | ICD-10-CM

## 2022-05-04 DIAGNOSIS — E878 Other disorders of electrolyte and fluid balance, not elsewhere classified: Secondary | ICD-10-CM | POA: Diagnosis not present

## 2022-05-04 DIAGNOSIS — R000 Tachycardia, unspecified: Secondary | ICD-10-CM | POA: Diagnosis not present

## 2022-05-04 LAB — BASIC METABOLIC PANEL
Anion gap: 11 (ref 5–15)
BUN: 16 mg/dL (ref 6–20)
CO2: 22 mmol/L (ref 22–32)
Calcium: 9.4 mg/dL (ref 8.9–10.3)
Chloride: 94 mmol/L — ABNORMAL LOW (ref 98–111)
Creatinine, Ser: 1.34 mg/dL — ABNORMAL HIGH (ref 0.61–1.24)
GFR, Estimated: >60 mL/min (ref >60)
Glucose, Bld: 758 mg/dL (ref 70–99)
Potassium: 4.3 mmol/L (ref 3.5–5.1)
Sodium: 127 mmol/L — ABNORMAL LOW (ref 135–145)

## 2022-05-04 LAB — URINALYSIS, ROUTINE W REFLEX MICROSCOPIC
Bilirubin Urine: NEGATIVE
Glucose, UA: >=500 mg/dL — AB
Hgb urine dipstick: NEGATIVE
Ketones, ur: NEGATIVE mg/dL
Leukocytes,Ua: NEGATIVE
Nitrite: NEGATIVE
Protein, ur: NEGATIVE mg/dL
Specific Gravity, Urine: <=1.005 (ref 1.005–1.030)
pH: 5.5 (ref 5.0–8.0)

## 2022-05-04 LAB — CBC
HCT: 42 % (ref 39.0–52.0)
Hemoglobin: 14.2 g/dL (ref 13.0–17.0)
MCH: 29.2 pg (ref 26.0–34.0)
MCHC: 33.8 g/dL (ref 30.0–36.0)
MCV: 86.2 fL (ref 80.0–100.0)
Platelets: 291 10*3/uL (ref 150–400)
RBC: 4.87 MIL/uL (ref 4.22–5.81)
RDW: 11.6 % (ref 11.5–15.5)
WBC: 6.7 10*3/uL (ref 4.0–10.5)
nRBC: 0 % (ref 0.0–0.2)

## 2022-05-04 LAB — CBG MONITORING, ED
Glucose-Capillary: >600 mg/dL (ref 70–99)
Glucose-Capillary: 586 mg/dL (ref 70–99)
Glucose-Capillary: 421 mg/dL — ABNORMAL HIGH (ref 70–99)
Glucose-Capillary: 325 mg/dL — ABNORMAL HIGH (ref 70–99)

## 2022-05-04 LAB — I-STAT VENOUS BLOOD GAS, ED
Acid-base deficit: 1 mmol/L (ref 0.0–2.0)
Bicarbonate: 22.3 mmol/L (ref 20.0–28.0)
Calcium, Ion: 1.14 mmol/L — ABNORMAL LOW (ref 1.15–1.40)
HCT: 46 % (ref 39.0–52.0)
Hemoglobin: 15.6 g/dL (ref 13.0–17.0)
O2 Saturation: 98 %
Potassium: 4.1 mmol/L (ref 3.5–5.1)
Sodium: 129 mmol/L — ABNORMAL LOW (ref 135–145)
TCO2: 23 mmol/L (ref 22–32)
pCO2, Ven: 33.6 mmHg — ABNORMAL LOW (ref 44–60)
pH, Ven: 7.429 (ref 7.25–7.43)
pO2, Ven: 97 mmHg — ABNORMAL HIGH (ref 32–45)

## 2022-05-04 LAB — URINALYSIS, MICROSCOPIC (REFLEX)

## 2022-05-04 LAB — HEMOGLOBIN A1C
Hgb A1c MFr Bld: 10.5 % — ABNORMAL HIGH (ref 4.8–5.6)
Mean Plasma Glucose: 254.65 mg/dL

## 2022-05-04 LAB — BETA-HYDROXYBUTYRIC ACID: Beta-Hydroxybutyric Acid: 0.17 mmol/L (ref 0.05–0.27)

## 2022-05-04 MED ORDER — SODIUM CHLORIDE 0.9 % IV BOLUS
1000.0000 mL | Freq: Once | INTRAVENOUS | Status: AC
  Administered 2022-05-04: 1000 mL via INTRAVENOUS

## 2022-05-04 MED ORDER — INSULIN ASPART 100 UNIT/ML IJ SOLN
5.0000 [IU] | Freq: Once | INTRAMUSCULAR | Status: AC
  Administered 2022-05-04: 5 [IU] via SUBCUTANEOUS

## 2022-05-04 MED ORDER — METFORMIN HCL 500 MG PO TABS: 500.0000 mg | ORAL_TABLET | Freq: Two times a day (BID) | ORAL | 0 refills | Status: DC

## 2022-05-04 NOTE — ED Notes (Signed)
Since Saturday has had back pain, a lot of urinary frequency, has been very thirsty as well. Has had some dizziness. Skin warm and dry, gait very steady, GCS 15. Tachycardia noted on monitor

## 2022-05-04 NOTE — ED Triage Notes (Signed)
Pt arrives pov, steady gait, c/o right lower back pain and urinary freq x 1 week. Denies fever, denies injury.

## 2022-05-04 NOTE — ED Provider Notes (Signed)
[facility] EMERGENCY DEPARTMENT Provider Note   CSN: [id number] Arrival date & time: 05/04/22  1311     History Chief Complaint  Patient presents with   Urinary Frequency    Aaron Park is a 31 y.o. male patient who presents to the emergency department with right lower back pain, urinary frequency, and polydipsia over the last week.  Patient declines any history of diabetes and does not take any medication for diabetes.  Does endorse family history of diabetes.  He reports associated blurred vision intermittently.  He denies fever, chills, abdominal pain, diarrhea.  He does endorse nausea and 1 episode of vomiting.   Urinary Frequency       Home Medications Prior to Admission medications   Medication Sig Start Date End Date Taking? Authorizing Provider  metFORMIN (GLUCOPHAGE) 500 MG tablet Take 1 tablet (500 mg total) by mouth 2 (two) times daily with a meal. 05/04/22  Yes Meredeth Ide, Timm Bonenberger M, PA-C  cyclobenzaprine (FLEXERIL) 10 MG tablet Take 1 tablet (10 mg total) by mouth at bedtime as needed for muscle spasms. 02/03/22   Placido Sou, PA-C  dicyclomine (BENTYL) 20 MG tablet Take 1 tablet (20 mg total) by mouth 2 (two) times daily. 04/30/21   Palumbo, April, MD  naproxen (NAPROSYN) 500 MG tablet Take 1 tablet (500 mg total) by mouth 2 (two) times daily as needed. 02/03/22   Placido Sou, PA-C  ondansetron (ZOFRAN ODT) 8 MG disintegrating tablet 8mg  ODT q8 hours prn nausea 04/30/21   Palumbo, April, MD  Probiotic Product (ALIGN) 4 MG CAPS Take 1 capsule (4 mg total) by mouth 4 (four) times daily. 04/30/21   Palumbo, April, MD  rizatriptan (MAXALT-MLT) 10 MG disintegrating tablet Take 1 tablet (10 mg total) by mouth as needed for migraine. May repeat in 2 hours if needed 05/03/19   05/05/19, DO  topiramate (TOPAMAX) 25 MG tablet Take 1 tab/dayfor 3 days then 1 tab 2x/day for 3 days. Add a pill in AM and then PM over 3 day intervals until taking 2 pills  2x/day. 05/03/19   05/05/19, DO      Allergies    Patient has no known allergies.    Review of Systems   Review of Systems  Genitourinary:  Positive for frequency.  All other systems reviewed and are negative.   Physical Exam Updated Vital Signs BP (!) 142/107   Pulse 95   Temp 99.2 F (37.3 C) (Oral)   Resp 16   Ht 5\' 11"  (1.803 m)   Wt 108.9 kg   SpO2 99%   BMI 33.47 kg/m  Physical Exam Vitals and nursing note reviewed.  Constitutional:      General: He is not in acute distress.    Appearance: Normal appearance.  HENT:     Head: Normocephalic and atraumatic.  Eyes:     General:        Right eye: No discharge.        Left eye: No discharge.  Cardiovascular:     Rate and Rhythm: Tachycardia present.     Comments: S1/S2 are distinct without any evidence of murmur, rubs, or gallops.  Radial pulses are 2+ bilaterally.  Dorsalis pedis pulses are 2+ bilaterally.  No evidence of pedal edema. Pulmonary:     Comments: Clear to auscultation bilaterally.  Normal effort.  No respiratory distress.  No evidence of wheezes, rales, or rhonchi heard throughout. Abdominal:     General: Abdomen is flat. Bowel sounds  are normal. There is no distension.     Tenderness: There is no abdominal tenderness. There is no guarding or rebound.  Musculoskeletal:        General: Normal range of motion.     Cervical back: Neck supple.  Skin:    General: Skin is warm and dry.     Findings: No rash.  Neurological:     General: No focal deficit present.     Mental Status: He is alert.  Psychiatric:        Mood and Affect: Mood normal.        Behavior: Behavior normal.     ED Results / Procedures / Treatments   Labs (all labs ordered are listed, but only abnormal results are displayed) Labs Reviewed  BASIC METABOLIC PANEL - Abnormal; Notable for the following components:      Result Value   Sodium 127 (*)    Chloride 94 (*)    Glucose, Bld 758 (*)    Creatinine, Ser 1.34  (*)    All other components within normal limits  URINALYSIS, ROUTINE W REFLEX MICROSCOPIC - Abnormal; Notable for the following components:   Glucose, UA >=500 (*)    All other components within normal limits  URINALYSIS, MICROSCOPIC (REFLEX) - Abnormal; Notable for the following components:   Bacteria, UA RARE (*)    All other components within normal limits  CBG MONITORING, ED - Abnormal; Notable for the following components:   Glucose-Capillary >600 (*)    All other components within normal limits  CBG MONITORING, ED - Abnormal; Notable for the following components:   Glucose-Capillary 586 (*)    All other components within normal limits  I-STAT VENOUS BLOOD GAS, ED - Abnormal; Notable for the following components:   pCO2, Ven 33.6 (*)    pO2, Ven 97 (*)    Sodium 129 (*)    Calcium, Ion 1.14 (*)    All other components within normal limits  CBG MONITORING, ED - Abnormal; Notable for the following components:   Glucose-Capillary 421 (*)    All other components within normal limits  CBG MONITORING, ED - Abnormal; Notable for the following components:   Glucose-Capillary 325 (*)    All other components within normal limits  CBC  HEMOGLOBIN A1C  BETA-HYDROXYBUTYRIC ACID  CBG MONITORING, ED  CBG MONITORING, ED    EKG EKG Interpretation  Date/Time:  Saturday May 04 2022 14:09:43 EDT Ventricular Rate:  128 PR Interval:  158 QRS Duration: 80 QT Interval:  292 QTC Calculation: 426 R Axis:   110 Text Interpretation: Sinus tachycardia Right axis deviation Abnormal ECG When compared with ECG of 23-Mar-2018 08:37, increased rate from prior 6/19 Confirmed by Meridee Score [phone]) on 05/04/2022 2:26:26 PM  Radiology No results found.  Procedures Procedures    Medications Ordered in ED Medications  sodium chloride 0.9 % bolus 1,000 mL (0 mLs Intravenous Stopped 05/04/22 1605)  insulin aspart (novoLOG) injection 5 Units (5 Units Subcutaneous Given 05/04/22 1446)  sodium  chloride 0.9 % bolus 1,000 mL (0 mLs Intravenous Stopped 05/04/22 1811)    ED Course/ Medical Decision Making/ A&P Clinical Course as of 05/04/22 1821  Sat May 04, 2022  1440 He is here with 1 week of back pain polyuria polydipsia.  No prior history of diabetes although does not see a doctor.  Tachycardic here although no otherwise nontoxic-appearing.  Getting labs urinalysis IV fluids and insulin.  Disposition per results of testing [MB]  1815 On reevaluation,  patient states he is feeling better.  His polyuria and polydipsia have completely resolved.  Vital signs are normalized.  Tachycardia has improved.  Most recent blood sugar is down to 3 2:25 liters of fluid and 5 units of insulin.  I Minna start the patient on 500 mg twice daily metformin.  He follows up with his primary care doctor on Tuesday. [CF]  1815 CBC Normal. [CF]  1816 Urinalysis, Routine w reflex microscopic(!) Other than glucosuria there is no evidence of infection. [CF]  1816 Basic metabolic panel(!!) Glucose 758 with elevated creatinine.  There is evidence of hyponatremia and hypochloremia. [CF]  1817 I-Stat venous blood gas, ED(!) pH is normal.  Have a low suspicion for DKA at this time. [CF]  1817 Hemoglobin A1c Hemoglobin A1c is in process. [CF]  1817 Beta-hydroxybutyric acid In process. [CF]    Clinical Course User Index [CF] Teressa Lower, PA-C [MB] Terrilee Files, MD                           Medical Decision Making Aaron Park is a 31 y.o. male patient who presents to the emerged from today for further evaluation of elevated blood sugars, polydipsia, and polyuria.  Patient does not carry a diagnosis of diabetes but I am suspicious this is likely underlying type 2 diabetes.  We will give the patient a liter of fluids to hopefully improve his vital signs and get labs to evaluate for DKA versus HHS.  I have a low suspicion for HHS at this time as patient is not altered and answering my questions in  complete sentences.  I will also plan to give the patient 5 units of insulin.  I will plan to reevaluate once labs and liter of fluids are done.  Has been seen ED course.  Patient is feeling better.  I added on a second liter and got repeat CBGs every hour to ensure blood sugar is dropping.  Patient down to 325.  I will start the patient on metformin.  He will follow-up with his PCP on Tuesday.  He is safe for discharge at this time.  He is feeling immensely better.  Strict return precautions discussed.   Amount and/or Complexity of Data Reviewed Labs: ordered. Decision-making details documented in ED Course.  Risk Prescription drug management.   Final Clinical Impression(s) / ED Diagnoses Final diagnoses:  Hyperglycemia  Polyuria  Polydipsia    Rx / DC Orders ED Discharge Orders          Ordered    metFORMIN (GLUCOPHAGE) 500 MG tablet  2 times daily with meals        05/04/22 1820              Honor Loh [place], [place] 05/04/22 1821    Terrilee Files, MD 05/04/22 1845

## 2022-05-04 NOTE — Discharge Instructions (Addendum)
Please follow-up with your primary care doctor on Tuesday for further evaluation.  As we discussed, please take metformin twice daily with food.  It is not uncommon for this medication to upset your stomach in the beginning.  Please return to the emergency department for any worsening symptoms you might have.

## 2022-05-04 NOTE — ED Notes (Signed)
AVS provided to client, discussed making a follow up appt with his primary MD re: new onset of DM 2 - also provided some education on diet and exercise as well. Also discussed the importance of taking his metformin as prescribed. Opportunity for questions provided prior to DC to home with family

## 2022-05-04 NOTE — ED Notes (Signed)
Urine output 273 ml

## 2022-05-04 NOTE — ED Notes (Signed)
IV NS Bolus initiated

## 2022-05-05 ENCOUNTER — Other Ambulatory Visit: Payer: Self-pay

## 2022-05-05 DIAGNOSIS — M545 Low back pain, unspecified: Secondary | ICD-10-CM | POA: Diagnosis present

## 2022-05-05 NOTE — ED Triage Notes (Signed)
Pt POV c/o lower back pain x1.5 wk, worse with movement. Denies urinary sx.

## 2022-05-06 ENCOUNTER — Other Ambulatory Visit (HOSPITAL_BASED_OUTPATIENT_CLINIC_OR_DEPARTMENT_OTHER): Payer: Self-pay

## 2022-05-06 ENCOUNTER — Emergency Department (HOSPITAL_BASED_OUTPATIENT_CLINIC_OR_DEPARTMENT_OTHER)
Admission: EM | Admit: 2022-05-06 | Discharge: 2022-05-06 | Disposition: A | Discharge status: Home / Self Care | Payer: PRIVATE HEALTH INSURANCE | Attending: Emergency Medicine | Admitting: Emergency Medicine

## 2022-05-06 DIAGNOSIS — M545 Low back pain, unspecified: Secondary | ICD-10-CM

## 2022-05-06 MED ORDER — TRAMADOL HCL 50 MG PO TABS
50.0000 mg | ORAL_TABLET | Freq: Four times a day (QID) | ORAL | 0 refills | Status: DC | PRN
  Filled 2022-05-06: qty 15, 4d supply, fill #0

## 2022-05-06 MED ORDER — NAPROXEN 500 MG PO TABS
500.0000 mg | ORAL_TABLET | Freq: Two times a day (BID) | ORAL | 0 refills | Status: DC
  Filled 2022-05-06: qty 20, 10d supply, fill #0

## 2022-05-06 MED ORDER — KETOROLAC TROMETHAMINE 60 MG/2ML IM SOLN
60.0000 mg | Freq: Once | INTRAMUSCULAR | Status: AC
  Administered 2022-05-06: 60 mg via INTRAMUSCULAR
  Filled 2022-05-06: qty 2

## 2022-05-06 NOTE — Discharge Instructions (Addendum)
Begin taking naproxen as prescribed.  Begin taking tramadol as prescribed as needed for pain not relieved with naproxen.  Follow-up with primary doctor if symptoms are not improving in the next week.

## 2022-05-06 NOTE — ED Provider Notes (Signed)
[facility] EMERGENCY DEPARTMENT Provider Note   CSN: [id number] Arrival date & time: 05/05/22  2240     History  Chief Complaint  Patient presents with   Back Pain    Aaron Park is a 31 y.o. male.  Patient is a 31 year old male with no significant past medical history.  Patient presenting today with complaints of low back pain.  This has been worsening over the past 1-1/2 weeks.  Pain is in the lumbar region with no radiation to the legs.  He denies any weakness, numbness, or bowel or bladder complaints.  Pain seems to worsen throughout the day.  Pain is exacerbated with bending and moving.  There are no alleviating factors.  The history is provided by the patient.       Home Medications Prior to Admission medications   Medication Sig Start Date End Date Taking? Authorizing Provider  cyclobenzaprine (FLEXERIL) 10 MG tablet Take 1 tablet (10 mg total) by mouth at bedtime as needed for muscle spasms. 02/03/22   Placido Sou, PA-C  dicyclomine (BENTYL) 20 MG tablet Take 1 tablet (20 mg total) by mouth 2 (two) times daily. 04/30/21   Palumbo, April, MD  metFORMIN (GLUCOPHAGE) 500 MG tablet Take 1 tablet (500 mg total) by mouth 2 (two) times daily with a meal. 05/04/22   Meredeth Ide, Conner M, PA-C  naproxen (NAPROSYN) 500 MG tablet Take 1 tablet (500 mg total) by mouth 2 (two) times daily as needed. 02/03/22   Placido Sou, PA-C  ondansetron (ZOFRAN ODT) 8 MG disintegrating tablet 8mg  ODT q8 hours prn nausea 04/30/21   Palumbo, April, MD  Probiotic Product (ALIGN) 4 MG CAPS Take 1 capsule (4 mg total) by mouth 4 (four) times daily. 04/30/21   Palumbo, April, MD  rizatriptan (MAXALT-MLT) 10 MG disintegrating tablet Take 1 tablet (10 mg total) by mouth as needed for migraine. May repeat in 2 hours if needed 05/03/19   05/05/19, DO  topiramate (TOPAMAX) 25 MG tablet Take 1 tab/dayfor 3 days then 1 tab 2x/day for 3 days. Add a pill in AM and then PM over 3 day  intervals until taking 2 pills 2x/day. 05/03/19   05/05/19, DO      Allergies    Patient has no known allergies.    Review of Systems   Review of Systems  All other systems reviewed and are negative.   Physical Exam Updated Vital Signs Pulse (!) 102   Temp 98.8 F (37.1 C) (Oral)   Resp 20   Ht 5\' 11"  (1.803 m)   Wt 108.9 kg   SpO2 100%   BMI 33.47 kg/m  Physical Exam Vitals and nursing note reviewed.  Constitutional:      General: He is not in acute distress.    Appearance: Normal appearance. He is not ill-appearing.  HENT:     Head: Normocephalic and atraumatic.  Pulmonary:     Effort: Pulmonary effort is normal.  Musculoskeletal:     Comments: There is tenderness to palpation in the soft tissues of the lumbar region.  There is no bony tenderness.  Neurological:     Mental Status: He is alert and oriented to person, place, and time.     Comments: DTRs are 1+ and symmetrical in the patellar and Achilles tendons bilaterally.  Strength is 5 out of 5.  He is able to walk on heels and toes without difficulty.     ED Results / Procedures / Treatments  Labs (all labs ordered are listed, but only abnormal results are displayed) Labs Reviewed - No data to display  EKG None  Radiology No results found.  Procedures Procedures    Medications Ordered in ED Medications  ketorolac (TORADOL) injection 60 mg (has no administration in time range)    ED Course/ Medical Decision Making/ A&P  Patient presenting today with complaints of low back pain that appears musculoskeletal in nature.  There are no red flags that would suggest an emergent situation.  Patient to be given IM Toradol, then discharged with naproxen and tramadol.  To follow-up with primary doctor if he is not improving in the next week.  Final Clinical Impression(s) / ED Diagnoses Final diagnoses:  None    Rx / DC Orders ED Discharge Orders     None         Geoffery Lyons,  MD 05/06/22 0157

## 2022-08-29 ENCOUNTER — Other Ambulatory Visit: Payer: Self-pay | Admitting: Family Medicine

## 2022-08-29 ENCOUNTER — Other Ambulatory Visit (HOSPITAL_BASED_OUTPATIENT_CLINIC_OR_DEPARTMENT_OTHER): Payer: Self-pay

## 2022-08-29 DIAGNOSIS — I10 Essential (primary) hypertension: Secondary | ICD-10-CM

## 2022-08-29 MED ORDER — AMLODIPINE BESYLATE 5 MG PO TABS
5.0000 mg | ORAL_TABLET | Freq: Every day | ORAL | 3 refills | Status: DC
  Filled 2022-08-29: qty 30, 30d supply, fill #0

## 2022-08-29 NOTE — Progress Notes (Signed)
Pt called and stated blood pressure has been elevated in the 150/90s. He was previously on blood pressure medication. Started pt on amlodipine 5mg 

## 2022-09-03 ENCOUNTER — Other Ambulatory Visit: Payer: Self-pay | Admitting: Family Medicine

## 2022-09-03 ENCOUNTER — Other Ambulatory Visit (HOSPITAL_BASED_OUTPATIENT_CLINIC_OR_DEPARTMENT_OTHER): Payer: Self-pay

## 2022-09-03 MED ORDER — AMLODIPINE BESYLATE 10 MG PO TABS
10.0000 mg | ORAL_TABLET | Freq: Every day | ORAL | 0 refills | Status: DC
  Filled 2022-09-03 – 2022-09-16 (×2): qty 90, 90d supply, fill #0

## 2022-09-10 ENCOUNTER — Other Ambulatory Visit (HOSPITAL_BASED_OUTPATIENT_CLINIC_OR_DEPARTMENT_OTHER): Payer: Self-pay

## 2022-09-16 ENCOUNTER — Ambulatory Visit (INDEPENDENT_AMBULATORY_CARE_PROVIDER_SITE_OTHER): Payer: PRIVATE HEALTH INSURANCE | Admitting: Family Medicine

## 2022-09-16 ENCOUNTER — Other Ambulatory Visit (HOSPITAL_BASED_OUTPATIENT_CLINIC_OR_DEPARTMENT_OTHER): Payer: Self-pay

## 2022-09-16 ENCOUNTER — Encounter: Payer: Self-pay | Admitting: Family Medicine

## 2022-09-16 VITALS — BP 131/79 | HR 109 | Ht 70.47 in | Wt 242.1 lb

## 2022-09-16 DIAGNOSIS — Z13220 Encounter for screening for lipoid disorders: Secondary | ICD-10-CM

## 2022-09-16 DIAGNOSIS — R739 Hyperglycemia, unspecified: Secondary | ICD-10-CM

## 2022-09-16 DIAGNOSIS — E559 Vitamin D deficiency, unspecified: Secondary | ICD-10-CM

## 2022-09-16 DIAGNOSIS — I10 Essential (primary) hypertension: Secondary | ICD-10-CM

## 2022-09-16 DIAGNOSIS — R7401 Elevation of levels of liver transaminase levels: Secondary | ICD-10-CM | POA: Diagnosis not present

## 2022-09-16 DIAGNOSIS — E119 Type 2 diabetes mellitus without complications: Secondary | ICD-10-CM | POA: Diagnosis not present

## 2022-09-16 LAB — POCT GLYCOSYLATED HEMOGLOBIN (HGB A1C): Hemoglobin A1C: 6.9 % — AB (ref 4.0–5.6)

## 2022-09-16 MED ORDER — RYBELSUS 7 MG PO TABS
7.0000 mg | ORAL_TABLET | Freq: Every day | ORAL | 1 refills | Status: DC
  Filled 2022-09-16 – 2022-10-17 (×3): qty 30, 30d supply, fill #0

## 2022-09-16 MED ORDER — VITAMIN D (ERGOCALCIFEROL) 1.25 MG (50000 UNIT) PO CAPS
50000.0000 [IU] | ORAL_CAPSULE | ORAL | 0 refills | Status: DC
  Filled 2022-09-16: qty 12, 84d supply, fill #0

## 2022-09-16 MED ORDER — RYBELSUS 3 MG PO TABS: 3.0000 mg | ORAL_TABLET | Freq: Every day | ORAL | 0 refills | Status: DC

## 2022-09-16 MED ORDER — RYBELSUS 7 MG PO TABS
7.0000 mg | ORAL_TABLET | Freq: Every day | ORAL | 3 refills | Status: DC
  Filled 2022-09-16: qty 30, fill #0

## 2022-09-16 NOTE — Patient Instructions (Signed)
Let's try rybelsus.  Start 3mg  daily x30 days then increase to 7mg .  Have labs completed when fasting.  Follow up with me in 3 months.

## 2022-09-17 ENCOUNTER — Telehealth: Payer: Self-pay

## 2022-09-17 ENCOUNTER — Other Ambulatory Visit (HOSPITAL_BASED_OUTPATIENT_CLINIC_OR_DEPARTMENT_OTHER): Payer: Self-pay

## 2022-09-17 DIAGNOSIS — I10 Essential (primary) hypertension: Secondary | ICD-10-CM | POA: Insufficient documentation

## 2022-09-17 DIAGNOSIS — E559 Vitamin D deficiency, unspecified: Secondary | ICD-10-CM | POA: Insufficient documentation

## 2022-09-17 DIAGNOSIS — R7401 Elevation of levels of liver transaminase levels: Secondary | ICD-10-CM | POA: Insufficient documentation

## 2022-09-17 DIAGNOSIS — E119 Type 2 diabetes mellitus without complications: Secondary | ICD-10-CM | POA: Insufficient documentation

## 2022-09-17 NOTE — Telephone Encounter (Signed)
Initiated Prior authorization [id number] 7MG  tablets Via: Covermymeds Case/Key: Status: approved  as of 09/17/22 Reason: The authorization is effective from 09/17/2022 to 09/17/2023, Notified Pt via: Mychart

## 2022-09-17 NOTE — Assessment & Plan Note (Signed)
Update LFT's 

## 2022-09-17 NOTE — Progress Notes (Signed)
Aaron Park - 31 y.o. male MRN [id number]  Date of birth: 14-Aug-1991  Subjective Chief Complaint  Patient presents with   Establish Care    HPI .Aaron Park is a 31 year old very pleasant young man here today for initial visit to establish care.  He had labs earlier this year which did show significant elevated blood glucose consistent with diabetes.  He was never started on any medication for management of this however he has been working on modifications of his diet to help with this.  He has not had any significant symptoms related to his diabetes at this time including neuropathic symptoms or urinary changes.  Additionally, he was noted to have high blood pressure recently.  He was having increased headaches which may be related to his uncontrolled hypertension.  He was started on amlodipine which has been titrated to 10 mg.  He is tolerating this well with better control of his blood pressure.  He denies chest pain, shortness of breath, palpitations, headaches or vision changes.  He is admitted to decreased energy levels.  Vitamin D levels were noted to be quite low.  He is not currently taking any supplements for this.  ROS:  A comprehensive ROS was completed and negative except as noted per HPI  No Known Allergies  Past Medical History:  Diagnosis Date   Asthma    Hypertension    Migraine     Past Surgical History:  Procedure Laterality Date   NO PAST SURGERIES      Social History   Socioeconomic History   Marital status: Single    Spouse name: Not on file   Number of children: Not on file   Years of education: Not on file   Highest education level: Not on file  Occupational History   Not on file  Tobacco Use   Smoking status: Never   Smokeless tobacco: Never  Vaping Use   Vaping Use: Never used  Substance and Sexual Activity   Alcohol use: Never   Drug use: Never   Sexual activity: Not on file  Other Topics Concern   Not on file  Social History  Narrative   Not on file   Social Determinants of Health   Financial Resource Strain: Not on file  Food Insecurity: Not on file  Transportation Needs: Not on file  Physical Activity: Not on file  Stress: Not on file  Social Connections: Not on file    Family History  Problem Relation Age of Onset   Diabetes Father     Health Maintenance  Topic Date Due   FOOT EXAM  Never done   OPHTHALMOLOGY EXAM  Never done   Diabetic kidney evaluation - Urine ACR  Never done   COVID-19 Vaccine (1) 12/20/2022 (Originally 06/05/1991)   INFLUENZA VACCINE  01/19/2023 (Originally 05/21/2022)   HEMOGLOBIN A1C  03/17/2023   Diabetic kidney evaluation - GFR measurement  05/05/2023   Hepatitis C Screening  Completed   HIV Screening  Completed   HPV VACCINES  Aged Out     ----------------------------------------------------------------------------------------------------------------------------------------------------------------------------------------------------------------- Physical Exam BP 131/79 (BP Location: Left Arm, Patient Position: Sitting, Cuff Size: Large)   Pulse (!) 109   Ht 5' 10.47" (1.79 m)   Wt 242 lb 1.6 oz (109.8 kg)   SpO2 99%   BMI 34.27 kg/m   Physical Exam Constitutional:      Appearance: Normal appearance.  HENT:     Head: Normocephalic and atraumatic.  Eyes:     General: No scleral  icterus. Musculoskeletal:     Cervical back: Neck supple.  Neurological:     Mental Status: He is alert.  Psychiatric:        Mood and Affect: Mood normal.        Behavior: Behavior normal.     ------------------------------------------------------------------------------------------------------------------------------------------------------------------------------------------------------------------- Assessment and Plan  Essential hypertension Blood pressure is well controlled at this time with amlodipine at current strength.  Recommend continuation.  Encouraged weight loss  as well as low-sodium diet.  Type 2 diabetes mellitus without complication, without long-term current use of insulin (HCC) Diabetes has improved with dietary change.  Recommend addition of Rybelsus to help with continued blood sugar control as well as weight management.  We did review side effects as well as blackbox warnings.  Follow-up in about 3 months.  Vitamin D deficiency Previous vitamin D levels were quite low.  Adding 50,000 units weekly x12 weeks.  Transaminitis Update LFTs.   Meds ordered this encounter  Medications   DISCONTD: Semaglutide (RYBELSUS) 3 MG TABS    Sig: Take 3 mg by mouth daily. Lot: [id number] Exp: 11/2023    Dispense:  30 tablet    Refill:  0    <SAMPLE>   DISCONTD: Semaglutide (RYBELSUS) 7 MG TABS    Sig: Take 7 mg by mouth daily. Start after completion of 30 day trial of 3mg  tablets.    Dispense:  30 tablet    Refill:  3   Semaglutide (RYBELSUS) 7 MG TABS    Sig: Take 1 tablet (7 mg) by mouth daily. Start after completion of 30 day trial of 3mg  tablets.    Dispense:  90 tablet    Refill:  1   Semaglutide (RYBELSUS) 3 MG TABS    Sig: Take 3 mg by mouth daily. Lot: Exp: 11/2023    Dispense:  30 tablet    Refill:  0    <SAMPLE>   Vitamin D, Ergocalciferol, (DRISDOL) 1.25 MG (50000 UNIT) CAPS capsule    Sig: Take 1 capsule (50,000 Units total) by mouth every 7 (seven) days.    Dispense:  12 capsule    Refill:  0    Return in about 3 months (around 12/17/2022) for HTN/T2DM.    This visit occurred during the SARS-CoV-2 public health emergency.  Safety protocols were in place, including screening questions prior to the visit, additional usage of staff PPE, and extensive cleaning of exam room while observing appropriate contact time as indicated for disinfecting solutions.

## 2022-09-17 NOTE — Assessment & Plan Note (Signed)
Previous vitamin D levels were quite low.  Adding 50,000 units weekly x12 weeks.

## 2022-09-17 NOTE — Assessment & Plan Note (Signed)
Diabetes has improved with dietary change.  Recommend addition of Rybelsus to help with continued blood sugar control as well as weight management.  We did review side effects as well as blackbox warnings.  Follow-up in about 3 months.

## 2022-09-17 NOTE — Assessment & Plan Note (Signed)
Blood pressure is well controlled at this time with amlodipine at current strength.  Recommend continuation.  Encouraged weight loss as well as low-sodium diet.

## 2022-09-26 DIAGNOSIS — R7401 Elevation of levels of liver transaminase levels: Secondary | ICD-10-CM | POA: Diagnosis not present

## 2022-09-26 DIAGNOSIS — Z13220 Encounter for screening for lipoid disorders: Secondary | ICD-10-CM | POA: Diagnosis not present

## 2022-09-26 DIAGNOSIS — I10 Essential (primary) hypertension: Secondary | ICD-10-CM | POA: Diagnosis not present

## 2022-09-26 DIAGNOSIS — E119 Type 2 diabetes mellitus without complications: Secondary | ICD-10-CM | POA: Diagnosis not present

## 2022-09-27 ENCOUNTER — Other Ambulatory Visit (HOSPITAL_BASED_OUTPATIENT_CLINIC_OR_DEPARTMENT_OTHER): Payer: Self-pay

## 2022-09-27 LAB — CBC WITH DIFFERENTIAL/PLATELET
Absolute Monocytes: 582 cells/uL (ref 200–950)
Basophils Absolute: 22 cells/uL (ref 0–200)
Basophils Relative: 0.4 %
Eosinophils Absolute: 162 cells/uL (ref 15–500)
Eosinophils Relative: 2.9 %
HCT: 40.7 % (ref 38.5–50.0)
Hemoglobin: 13.7 g/dL (ref 13.2–17.1)
Lymphs Abs: 739 cells/uL — ABNORMAL LOW (ref 850–3900)
MCH: 29.2 pg (ref 27.0–33.0)
MCHC: 33.7 g/dL (ref 32.0–36.0)
MCV: 86.8 fL (ref 80.0–100.0)
MPV: 10.6 fL (ref 7.5–12.5)
Monocytes Relative: 10.4 %
Neutro Abs: 4094 cells/uL (ref 1500–7800)
Neutrophils Relative %: 73.1 %
Platelets: 299 10*3/uL (ref 140–400)
RBC: 4.69 10*6/uL (ref 4.20–5.80)
RDW: 11.7 % (ref 11.0–15.0)
Total Lymphocyte: 13.2 %
WBC: 5.6 10*3/uL (ref 3.8–10.8)

## 2022-09-27 LAB — HEPATIC FUNCTION PANEL
AG Ratio: 1.6 (calc) (ref 1.0–2.5)
ALT: 58 U/L — ABNORMAL HIGH (ref 9–46)
AST: 25 U/L (ref 10–40)
Albumin: 4.7 g/dL (ref 3.6–5.1)
Alkaline phosphatase (APISO): 62 U/L (ref 36–130)
Bilirubin, Direct: 0.1 mg/dL (ref 0.0–0.2)
Globulin: 3 g/dL (calc) (ref 1.9–3.7)
Indirect Bilirubin: 0.5 mg/dL (calc) (ref 0.2–1.2)
Total Bilirubin: 0.6 mg/dL (ref 0.2–1.2)
Total Protein: 7.7 g/dL (ref 6.1–8.1)

## 2022-09-27 LAB — MICROALBUMIN / CREATININE URINE RATIO
Creatinine, Urine: 147 mg/dL (ref 20–320)
Microalb Creat Ratio: 5 mcg/mg creat (ref <30)
Microalb, Ur: 0.8 mg/dL

## 2022-09-27 LAB — BASIC METABOLIC PANEL
BUN: 14 mg/dL (ref 7–25)
CO2: 25 mmol/L (ref 20–32)
Calcium: 9.6 mg/dL (ref 8.6–10.3)
Chloride: 102 mmol/L (ref 98–110)
Creat: 0.92 mg/dL (ref 0.60–1.26)
Glucose, Bld: 86 mg/dL (ref 65–99)
Potassium: 3.9 mmol/L (ref 3.5–5.3)
Sodium: 137 mmol/L (ref 135–146)

## 2022-09-27 LAB — LIPID PANEL W/REFLEX DIRECT LDL
Cholesterol: 142 mg/dL (ref <200)
HDL: 58 mg/dL (ref >=40)
LDL Cholesterol (Calc): 71 mg/dL (calc)
Non-HDL Cholesterol (Calc): 84 mg/dL (calc) (ref <130)
Total CHOL/HDL Ratio: 2.4 (calc) (ref <5.0)
Triglycerides: 52 mg/dL (ref <150)

## 2022-10-17 ENCOUNTER — Other Ambulatory Visit (HOSPITAL_BASED_OUTPATIENT_CLINIC_OR_DEPARTMENT_OTHER): Payer: Self-pay

## 2022-11-21 ENCOUNTER — Other Ambulatory Visit (HOSPITAL_BASED_OUTPATIENT_CLINIC_OR_DEPARTMENT_OTHER): Payer: Self-pay

## 2022-11-21 ENCOUNTER — Telehealth: Payer: Self-pay | Admitting: Sports Medicine

## 2022-11-21 DIAGNOSIS — J101 Influenza due to other identified influenza virus with other respiratory manifestations: Secondary | ICD-10-CM

## 2022-11-21 MED ORDER — BENZONATATE 200 MG PO CAPS
200.0000 mg | ORAL_CAPSULE | Freq: Three times a day (TID) | ORAL | 0 refills | Status: DC | PRN
Start: 2022-11-21 — End: 2023-01-23
  Filled 2022-11-21: qty 45, 15d supply, fill #0

## 2022-11-21 MED ORDER — HYDROCOD POLI-CHLORPHE POLI ER 10-8 MG/5ML PO SUER
5.0000 mL | Freq: Two times a day (BID) | ORAL | 0 refills | Status: DC | PRN
  Filled 2022-11-21: qty 120, 12d supply, fill #0

## 2022-11-21 NOTE — Telephone Encounter (Signed)
Aaron Park is having increasing cough, otherwise well, adding Tessalon Perles and Tussionex.

## 2022-11-22 ENCOUNTER — Other Ambulatory Visit (HOSPITAL_BASED_OUTPATIENT_CLINIC_OR_DEPARTMENT_OTHER): Payer: Self-pay

## 2022-11-22 DIAGNOSIS — J101 Influenza due to other identified influenza virus with other respiratory manifestations: Secondary | ICD-10-CM | POA: Insufficient documentation

## 2022-11-22 MED ORDER — OSELTAMIVIR PHOSPHATE 75 MG PO CAPS
75.0000 mg | ORAL_CAPSULE | Freq: Two times a day (BID) | ORAL | 0 refills | Status: DC
  Filled 2022-11-22: qty 10, 5d supply, fill #0

## 2022-11-22 NOTE — Addendum Note (Signed)
Addended by: Silverio Decamp on: 11/22/2022 10:10 AM   Modules accepted: Orders

## 2022-12-22 ENCOUNTER — Other Ambulatory Visit: Payer: Self-pay | Admitting: Family Medicine

## 2022-12-24 ENCOUNTER — Other Ambulatory Visit (HOSPITAL_BASED_OUTPATIENT_CLINIC_OR_DEPARTMENT_OTHER): Payer: Self-pay

## 2022-12-24 MED ORDER — AMLODIPINE BESYLATE 10 MG PO TABS
10.0000 mg | ORAL_TABLET | Freq: Every day | ORAL | 0 refills | Status: DC
  Filled 2022-12-24: qty 90, 90d supply, fill #0

## 2023-01-05 ENCOUNTER — Encounter (HOSPITAL_BASED_OUTPATIENT_CLINIC_OR_DEPARTMENT_OTHER): Payer: Self-pay | Admitting: Emergency Medicine

## 2023-01-05 ENCOUNTER — Other Ambulatory Visit: Payer: Self-pay

## 2023-01-05 ENCOUNTER — Emergency Department (HOSPITAL_BASED_OUTPATIENT_CLINIC_OR_DEPARTMENT_OTHER)
Admission: EM | Admit: 2023-01-05 | Discharge: 2023-01-05 | Disposition: A | Discharge status: Home / Self Care | Payer: 59 | Attending: Emergency Medicine | Admitting: Emergency Medicine

## 2023-01-05 DIAGNOSIS — M545 Low back pain, unspecified: Secondary | ICD-10-CM

## 2023-01-05 DIAGNOSIS — M5459 Other low back pain: Secondary | ICD-10-CM | POA: Diagnosis not present

## 2023-01-05 MED ORDER — NAPROXEN 500 MG PO TABS
500.0000 mg | ORAL_TABLET | Freq: Two times a day (BID) | ORAL | 0 refills | Status: DC
Start: 2023-01-05 — End: 2023-06-18

## 2023-01-05 MED ORDER — KETOROLAC TROMETHAMINE 30 MG/ML IJ SOLN
60.0000 mg | Freq: Once | INTRAMUSCULAR | Status: AC
  Administered 2023-01-05: 60 mg via INTRAMUSCULAR
  Filled 2023-01-05: qty 2
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
## 2023-01-05 NOTE — Discharge Instructions (Signed)
You were seen in the emergency department for low back pain. Given lack of concerning neurological signs, I believe that you are safe to discharge with a dose of Toradol given to you here in the ED and outpatient NSAID use. Please return to the ED if you begin to experience and lower leg weakness of numbness or groin numbness, loss of bowel or bladder control. Wait about 24 hours before taking any other NSAIDs as you were given a high dose of Toradol here in the ED.
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
## 2023-01-06 ENCOUNTER — Telehealth: Payer: Self-pay | Admitting: General Practice

## 2023-01-06 NOTE — Transitions of Care (Post Inpatient/ED Visit) (Signed)
   01/06/2023  Name: Abdulmajeed Maille MRN: [id number] DOB: 09-22-1991  Today's TOC FU Call Status: Today's TOC FU Call Status:: Successful TOC FU Call Competed TOC FU Call Complete Date: 01/06/23  Transition Care Management Follow-up Telephone Call Date of Discharge: 01/05/23 Discharge Facility: [facility] Type of Discharge: Emergency Department Reason for ED Visit: Orthopedic Conditions Orthopedic/Injury Diagnosis:  (back pain) How have you been since you were released from the hospital?: Better Any questions or concerns?: No  Items Reviewed: Did you receive and understand the discharge instructions provided?: Yes Medications obtained and verified?: Yes (Medications Reviewed) Any new allergies since your discharge?: No Dietary orders reviewed?: NA Do you have support at home?: Yes  Home Care and Equipment/Supplies: [place] Ordered?: NA Any new equipment or medical supplies ordered?: NA  Functional Questionnaire: Do you need assistance with bathing/showering or dressing?: No Do you need assistance with meal preparation?: No Do you need assistance with eating?: No Do you have difficulty maintaining continence: No Do you need assistance with getting out of bed/getting out of a chair/moving?: No Do you have difficulty managing or taking your medications?: No  Follow up appointments reviewed: PCP Follow-up appointment confirmed?: Yes Date of PCP follow-up appointment?:  (Patient already talked to PCP) Follow-up Provider: Dr. Zigmund Daniel Specialist [facility] Follow-up appointment confirmed?: NA Do you need transportation to your follow-up appointment?: No Do you understand care options if your condition(s) worsen?: Yes-patient verbalized understanding    SIGNATURE: Tinnie Gens, RN BSN

## 2023-01-07 ENCOUNTER — Ambulatory Visit: Payer: PRIVATE HEALTH INSURANCE | Admitting: Family Medicine

## 2023-01-23 ENCOUNTER — Encounter: Payer: Self-pay | Admitting: Family Medicine

## 2023-01-23 ENCOUNTER — Other Ambulatory Visit (HOSPITAL_BASED_OUTPATIENT_CLINIC_OR_DEPARTMENT_OTHER): Payer: Self-pay

## 2023-01-23 ENCOUNTER — Ambulatory Visit: Payer: 59 | Admitting: Family Medicine

## 2023-01-23 VITALS — BP 124/77 | HR 78 | Ht 70.47 in | Wt 217.2 lb

## 2023-01-23 DIAGNOSIS — R7989 Other specified abnormal findings of blood chemistry: Secondary | ICD-10-CM | POA: Diagnosis not present

## 2023-01-23 DIAGNOSIS — Z23 Encounter for immunization: Secondary | ICD-10-CM | POA: Diagnosis not present

## 2023-01-23 DIAGNOSIS — I10 Essential (primary) hypertension: Secondary | ICD-10-CM

## 2023-01-23 DIAGNOSIS — R7401 Elevation of levels of liver transaminase levels: Secondary | ICD-10-CM | POA: Diagnosis not present

## 2023-01-23 DIAGNOSIS — E119 Type 2 diabetes mellitus without complications: Secondary | ICD-10-CM | POA: Diagnosis not present

## 2023-01-23 LAB — POCT GLYCOSYLATED HEMOGLOBIN (HGB A1C): HbA1c, POC (controlled diabetic range): 5.6 % (ref 0.0–7.0)

## 2023-01-23 MED ORDER — RYBELSUS 7 MG PO TABS
7.0000 mg | ORAL_TABLET | Freq: Every day | ORAL | 1 refills | Status: DC
Start: 2023-01-23 — End: 2023-06-18
  Filled 2023-01-23: qty 90, 90d supply, fill #0

## 2023-01-23 NOTE — Assessment & Plan Note (Signed)
BP is well controlled with current strength of amlodipine.  Congratulated on weight loss and encouraged continuation of lifestyle changes.

## 2023-01-23 NOTE — Assessment & Plan Note (Signed)
Recheck LFTs 

## 2023-01-23 NOTE — Progress Notes (Signed)
Aaron Park - 32 y.o. male MRN [id number]  Date of birth: 08/02/1991  Subjective Chief Complaint  Patient presents with   Diabetes   Hypertension    HPI Aaron Park is a 32 y.o. male here today for follow up visit.   He reports that he is doing quite well.  He started on rybelsus at last visit.  He has had some mild GI symptoms with this but is overall doing quite well with this.  Blood sugars have been well controlled.  He is exercising more consistently.  Weight is down 25lbs since November.    BP remains well controlled with amlodipine.  Doing well with this and has not had side effects.  Denies chest pain, shortness of breath, palpitations, headache or vision changes.    ROS:  A comprehensive ROS was completed and negative except as noted per HPI  No Known Allergies  Past Medical History:  Diagnosis Date   Asthma    Hypertension    Migraine     Past Surgical History:  Procedure Laterality Date   NO PAST SURGERIES      Social History   Socioeconomic History   Marital status: Single    Spouse name: Not on file   Number of children: Not on file   Years of education: Not on file   Highest education level: Not on file  Occupational History   Not on file  Tobacco Use   Smoking status: Never   Smokeless tobacco: Never  Vaping Use   Vaping Use: Never used  Substance and Sexual Activity   Alcohol use: Never   Drug use: Never   Sexual activity: Not on file  Other Topics Concern   Not on file  Social History Narrative   Not on file   Social Determinants of Health   Financial Resource Strain: Not on file  Food Insecurity: Not on file  Transportation Needs: No Transportation Needs (01/06/2023)   PRAPARE - Hydrologist (Medical): No    Lack of Transportation (Non-Medical): No  Physical Activity: Not on file  Stress: Not on file  Social Connections: Not on file    Family History  Problem Relation Age of Onset   Diabetes  Father     Health Maintenance  Topic Date Due   COVID-19 Vaccine (1) Never done   OPHTHALMOLOGY EXAM  Never done   HIV Screening  Never done   DTaP/Tdap/Td (1 - Tdap) Never done   INFLUENZA VACCINE  05/22/2023   HEMOGLOBIN A1C  07/25/2023   Diabetic kidney evaluation - eGFR measurement  09/27/2023   Diabetic kidney evaluation - Urine ACR  09/27/2023   FOOT EXAM  01/23/2024   Hepatitis C Screening  Completed   HPV VACCINES  Aged Out     ----------------------------------------------------------------------------------------------------------------------------------------------------------------------------------------------------------------- Physical Exam BP 124/77 (BP Location: Left Arm, Patient Position: Sitting, Cuff Size: Normal)   Pulse 78   Ht 5' 10.47" (1.79 m)   Wt 217 lb 3.2 oz (98.5 kg)   SpO2 99%   BMI 30.75 kg/m   Physical Exam Constitutional:      Appearance: Normal appearance.  HENT:     Head: Normocephalic and atraumatic.  Eyes:     General: No scleral icterus. Neurological:     Mental Status: He is alert.  Psychiatric:        Mood and Affect: Mood normal.        Behavior: Behavior normal.     ------------------------------------------------------------------------------------------------------------------------------------------------------------------------------------------------------------------- Assessment and Plan  Essential hypertension BP is well controlled with current strength of amlodipine.  Congratulated on weight loss and encouraged continuation of lifestyle changes.    Type 2 diabetes mellitus without complication, without long-term current use of insulin (HCC) Excellent control of diabetes.  He will continue on Rybelsus at 7mg  daily.  Encouraged continued dietary and exercise changes.  F/u in 6 months.   Transaminitis Recheck LFT's   Meds ordered this encounter  Medications   Semaglutide (RYBELSUS) 7 MG TABS    Sig: Take 1  tablet (7 mg total) by mouth daily.    Dispense:  90 tablet    Refill:  1    No follow-ups on file.    This visit occurred during the SARS-CoV-2 public health emergency.  Safety protocols were in place, including screening questions prior to the visit, additional usage of staff PPE, and extensive cleaning of exam room while observing appropriate contact time as indicated for disinfecting solutions.

## 2023-01-23 NOTE — Assessment & Plan Note (Signed)
Excellent control of diabetes.  He will continue on Rybelsus at 7mg  daily.  Encouraged continued dietary and exercise changes.  F/u in 6 months.

## 2023-01-24 LAB — HEPATIC FUNCTION PANEL
AG Ratio: 1.7 (calc) (ref 1.0–2.5)
ALT: 30 U/L (ref 9–46)
AST: 14 U/L (ref 10–40)
Albumin: 4.5 g/dL (ref 3.6–5.1)
Alkaline phosphatase (APISO): 50 U/L (ref 36–130)
Bilirubin, Direct: 0.1 mg/dL (ref 0.0–0.2)
Globulin: 2.7 g/dL (calc) (ref 1.9–3.7)
Indirect Bilirubin: 0.5 mg/dL (calc) (ref 0.2–1.2)
Total Bilirubin: 0.6 mg/dL (ref 0.2–1.2)
Total Protein: 7.2 g/dL (ref 6.1–8.1)

## 2023-01-30 ENCOUNTER — Other Ambulatory Visit (HOSPITAL_BASED_OUTPATIENT_CLINIC_OR_DEPARTMENT_OTHER): Payer: Self-pay

## 2023-03-29 IMAGING — CT CT ABD-PELV W/ CM
2 of 4 series · 16 of 46 positions shown, 18 images · IV contrast (Omnipaque)
Comparison: CT 04/24/2020

CLINICAL DATA: Right lower quadrant pain

EXAM:
CT ABDOMEN AND PELVIS WITH CONTRAST
TECHNIQUE: Multidetector CT imaging of the abdomen and pelvis was performed
using the standard protocol following bolus administration of
intravenous contrast.
CONTRAST:  125mL OMNIPAQUE IOHEXOL 300 MG/ML  SOLN

[Series 2: axial st · axial · 0.96mm/px · z∈[+612,+1072]mm · 13 of 100 slices shown, 15 images]
[im 4/100  soft-tissue]
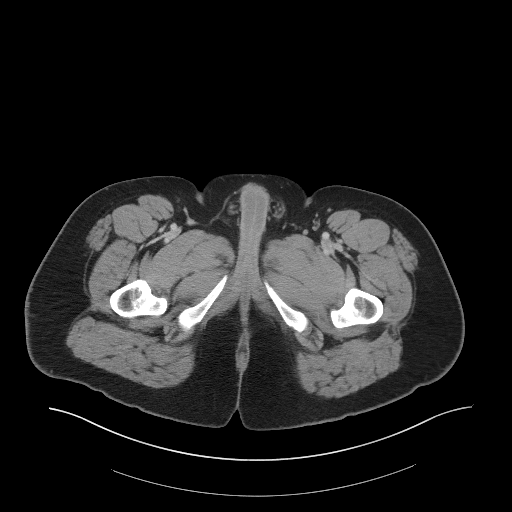
[im 4/100  bone]
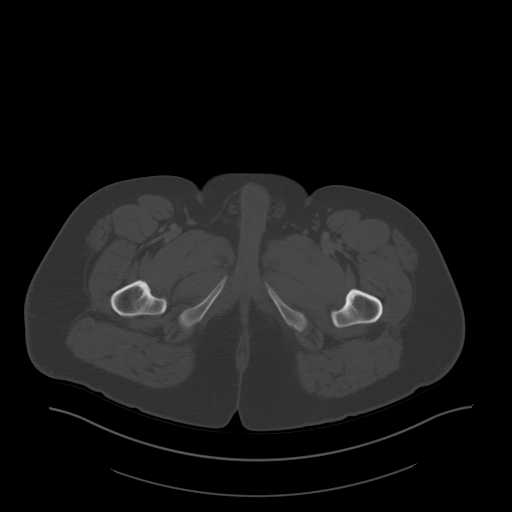
[im 12/100  soft-tissue]
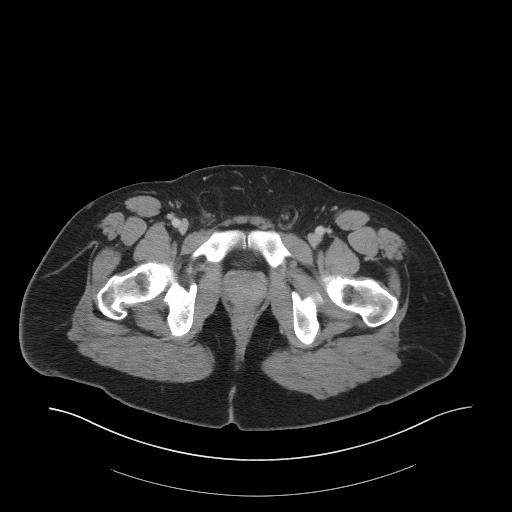
[im 20/100  soft-tissue]
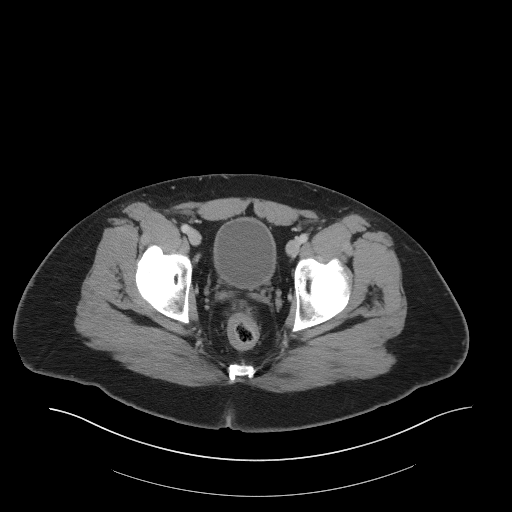
[im 28/100  soft-tissue]
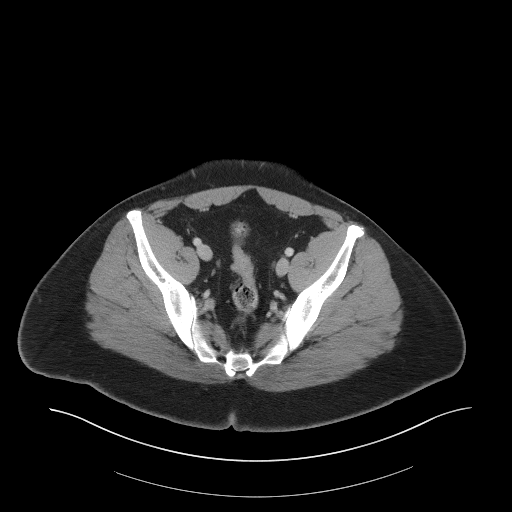
[im 36/100  soft-tissue]
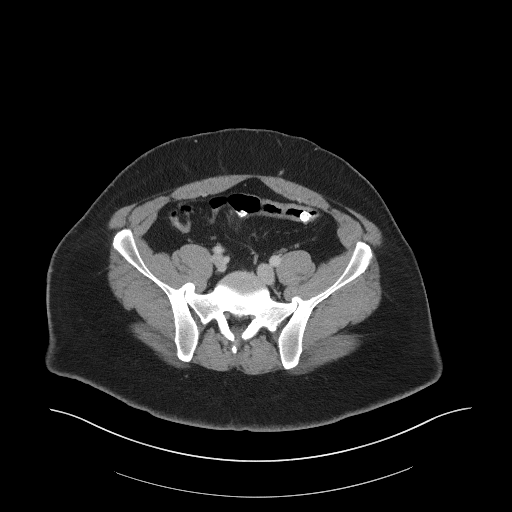
[im 44/100  soft-tissue]
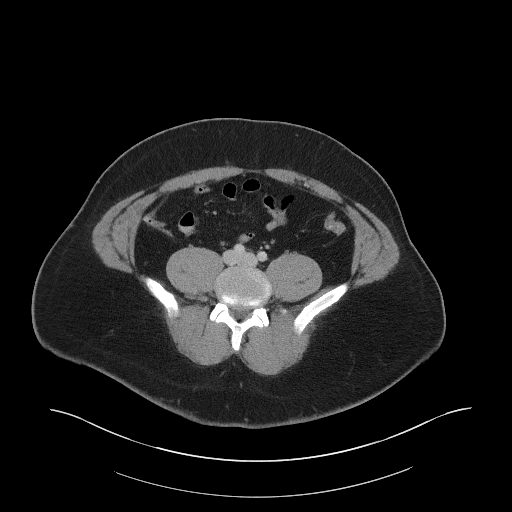
[im 52/100  soft-tissue]
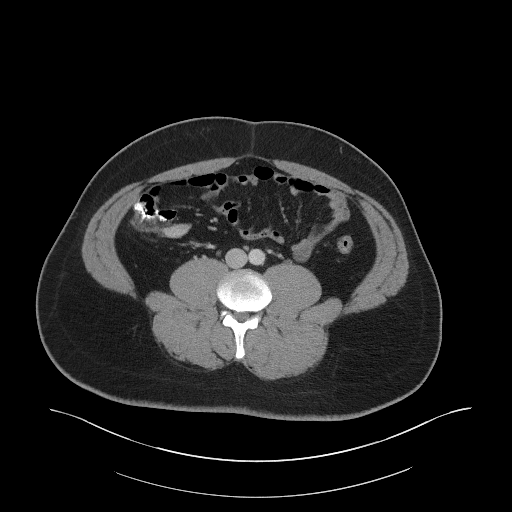
[im 56/100  soft-tissue]
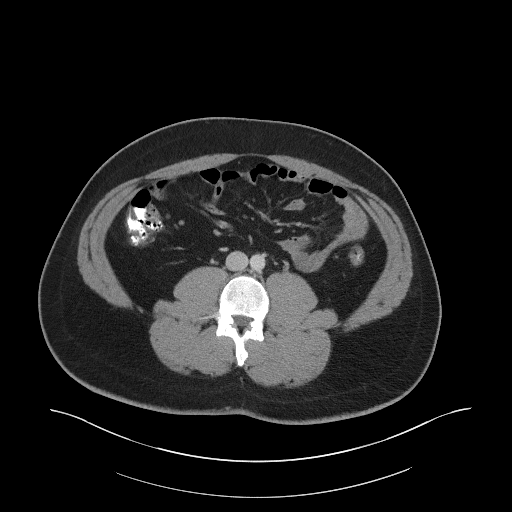
[im 64/100  soft-tissue]
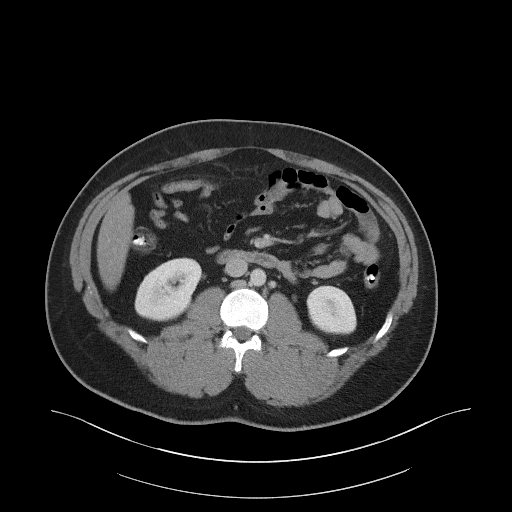
[im 64/100  bone]
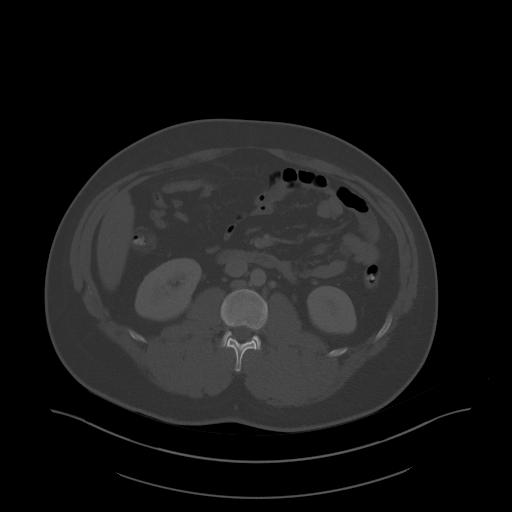
[im 72/100  soft-tissue]
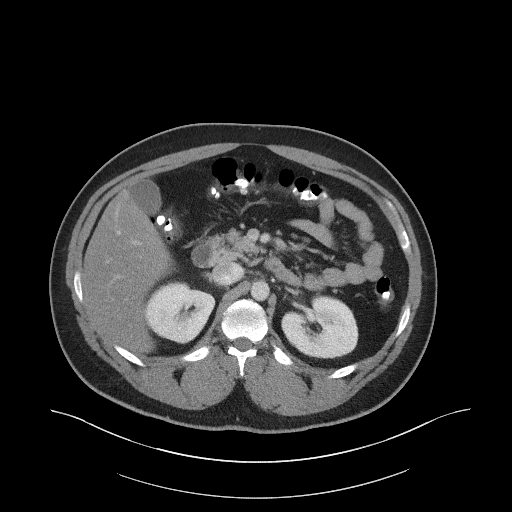
[im 80/100  soft-tissue]
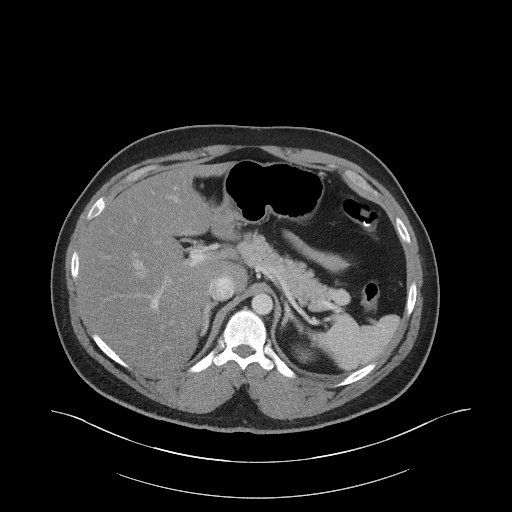
[im 88/100  soft-tissue]
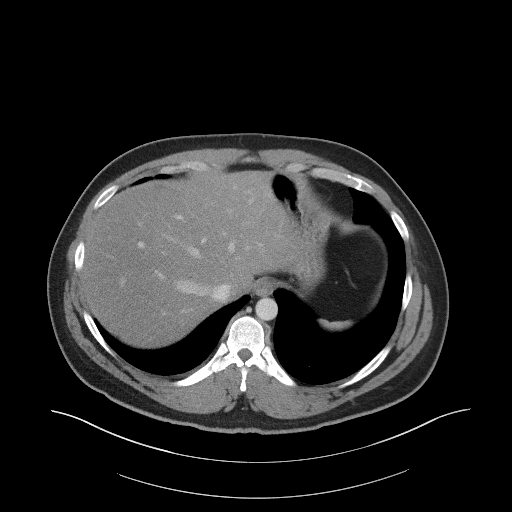
[im 96/100  soft-tissue]
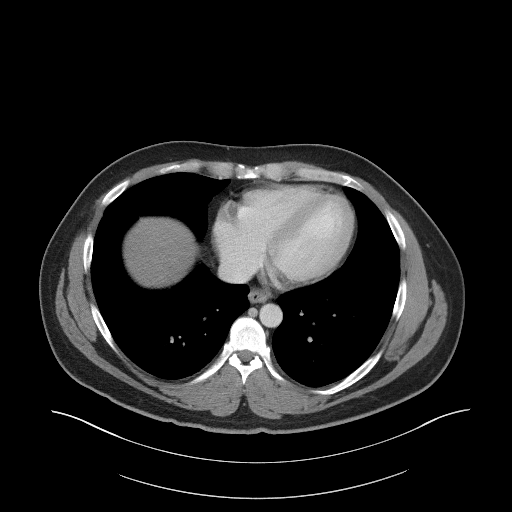

[Series 5: coronal st · coronal · 0.79mm/px · 3 of 94 slices shown]
[im 32/94  soft-tissue]
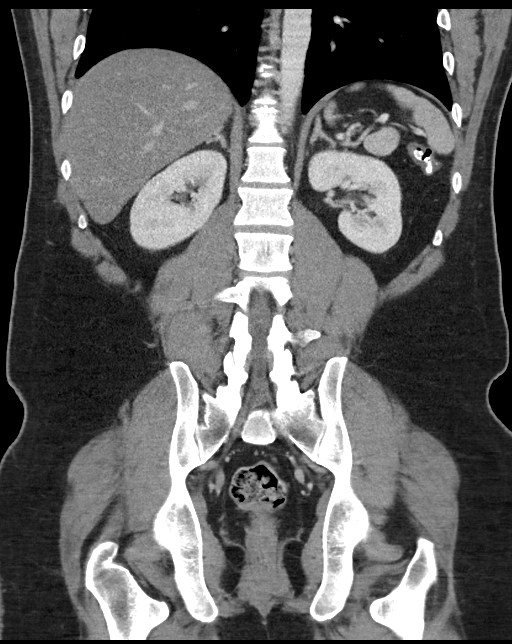
[im 42/94  soft-tissue]
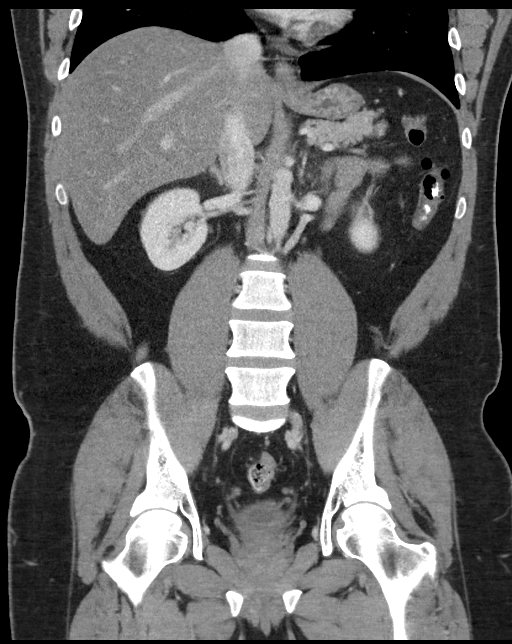
[im 52/94  soft-tissue]
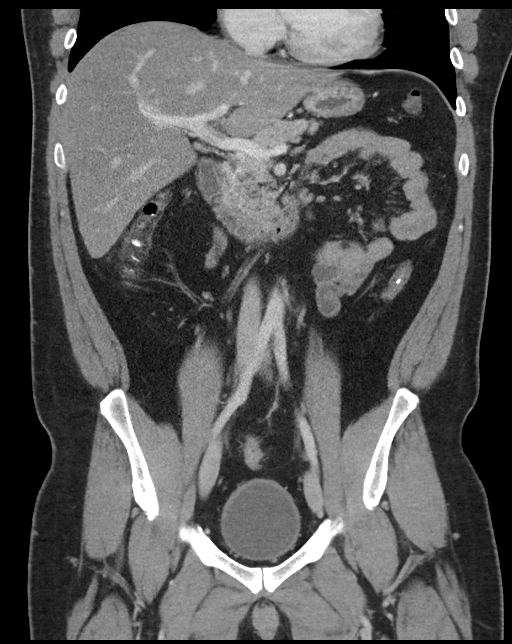

[16 of 46 positions shown; findings below may reference images not displayed]

FINDINGS: Lower chest: No acute abnormality.

Hepatobiliary: Hepatic steatosis. No calcified gallstone or biliary
dilatation

Pancreas: Unremarkable. No pancreatic ductal dilatation or
surrounding inflammatory changes.

Spleen: Normal in size without focal abnormality.

Adrenals/Urinary Tract: Adrenal glands are unremarkable. Kidneys are
normal, without renal calculi, focal lesion, or hydronephrosis.
Bladder is unremarkable.

Stomach/Bowel: Stomach is within normal limits. Appendix appears
normal. No evidence of bowel wall thickening, distention, or
inflammatory changes.

Vascular/Lymphatic: No significant vascular findings are present. No
enlarged abdominal or pelvic lymph nodes.

Reproductive: Prostate is unremarkable.

Other: No abdominal wall hernia or abnormality. No abdominopelvic
ascites.

Musculoskeletal: No acute or significant osseous findings.
IMPRESSION: 1. No CT evidence for acute intra-abdominal or pelvic abnormality.
2. Hepatic steatosis.

## 2023-06-18 ENCOUNTER — Other Ambulatory Visit (HOSPITAL_BASED_OUTPATIENT_CLINIC_OR_DEPARTMENT_OTHER): Payer: Self-pay

## 2023-06-18 ENCOUNTER — Ambulatory Visit (INDEPENDENT_AMBULATORY_CARE_PROVIDER_SITE_OTHER): Payer: 59 | Admitting: Family Medicine

## 2023-06-18 ENCOUNTER — Encounter: Payer: Self-pay | Admitting: Family Medicine

## 2023-06-18 VITALS — BP 156/92 | HR 88 | Ht 70.47 in | Wt 213.0 lb

## 2023-06-18 DIAGNOSIS — F4329 Adjustment disorder with other symptoms: Secondary | ICD-10-CM

## 2023-06-18 DIAGNOSIS — E119 Type 2 diabetes mellitus without complications: Secondary | ICD-10-CM

## 2023-06-18 DIAGNOSIS — I10 Essential (primary) hypertension: Secondary | ICD-10-CM

## 2023-06-18 LAB — POCT GLYCOSYLATED HEMOGLOBIN (HGB A1C): HbA1c, POC (controlled diabetic range): 5.5 % (ref 0.0–7.0)

## 2023-06-18 MED ORDER — AMLODIPINE BESYLATE 10 MG PO TABS
10.0000 mg | ORAL_TABLET | Freq: Every day | ORAL | 1 refills | Status: DC
  Filled 2023-06-18: qty 90, 90d supply, fill #0

## 2023-06-18 MED ORDER — RYBELSUS 7 MG PO TABS
7.0000 mg | ORAL_TABLET | Freq: Every day | ORAL | 1 refills | Status: DC
  Filled 2023-06-18: qty 90, 90d supply, fill #0

## 2023-06-18 MED ORDER — AMLODIPINE BESYLATE 10 MG PO TABS
10.0000 mg | ORAL_TABLET | Freq: Every day | ORAL | 1 refills | Status: DC
  Filled 2023-06-18: qty 90, 90d supply, fill #0
  Filled 2023-10-02: qty 90, 90d supply, fill #1

## 2023-06-18 MED ORDER — RYBELSUS 7 MG PO TABS
7.0000 mg | ORAL_TABLET | Freq: Every day | ORAL | 1 refills | Status: DC
  Filled 2023-06-18: qty 30, 30d supply, fill #0

## 2023-06-18 NOTE — Progress Notes (Signed)
Aaron Park - 32 y.o. male MRN [id number]  Date of birth: 1990/11/05  Subjective Chief Complaint  Patient presents with   Hypertension   Diabetes    HPI Aaron Park is a 32 y.o. male here today for follow up visit.   He was doing pretty well with Rybelsus, however has not been on this for the past month.  He has not checked blood glucose recently.  Weight has remained pretty stable and remains net negative from last visit.  He denies new/worsening symptoms of diabetes.  A1c is down to 5.5%.    Has had some increased stress recently related to some things at home.  He would be interested in seeing a therapist.    ROS:  A comprehensive ROS was completed and negative except as noted per HPI  No Known Allergies  Past Medical History:  Diagnosis Date   Asthma    Hypertension    Migraine     Past Surgical History:  Procedure Laterality Date   NO PAST SURGERIES      Social History   Socioeconomic History   Marital status: Single    Spouse name: Not on file   Number of children: Not on file   Years of education: Not on file   Highest education level: Not on file  Occupational History   Not on file  Tobacco Use   Smoking status: Never   Smokeless tobacco: Never  Vaping Use   Vaping status: Never Used  Substance and Sexual Activity   Alcohol use: Never   Drug use: Never   Sexual activity: Not on file  Other Topics Concern   Not on file  Social History Narrative   Not on file   Social Determinants of Health   Financial Resource Strain: Not on file  Food Insecurity: Not on file  Transportation Needs: No Transportation Needs (01/06/2023)   PRAPARE - Administrator, Civil Service (Medical): No    Lack of Transportation (Non-Medical): No  Physical Activity: Not on file  Stress: Not on file  Social Connections: Not on file    Family History  Problem Relation Age of Onset   Diabetes Father     Health Maintenance  Topic Date Due   COVID-19  Vaccine (1 - 2023-24 season) 07/04/2023 (Originally 06/21/2022)   OPHTHALMOLOGY EXAM  09/18/2023 (Originally 12/06/2001)   INFLUENZA VACCINE  01/19/2024 (Originally 05/22/2023)   HIV Screening  06/17/2024 (Originally 12/06/2006)   Diabetic kidney evaluation - eGFR measurement  09/27/2023   Diabetic kidney evaluation - Urine ACR  09/27/2023   HEMOGLOBIN A1C  12/19/2023   FOOT EXAM  01/23/2024   DTaP/Tdap/Td (2 - Td or Tdap) 01/22/2033   Hepatitis C Screening  Completed   HPV VACCINES  Aged Out     ----------------------------------------------------------------------------------------------------------------------------------------------------------------------------------------------------------------- Physical Exam BP (!) 156/92 (BP Location: Left Arm, Patient Position: Sitting, Cuff Size: Normal)   Pulse 88   Ht 5' 10.47" (1.79 m)   Wt 213 lb (96.6 kg)   SpO2 98%   BMI 30.16 kg/m   Physical Exam Constitutional:      Appearance: Normal appearance.  HENT:     Head: Normocephalic and atraumatic.  Eyes:     General: No scleral icterus. Cardiovascular:     Rate and Rhythm: Normal rate and regular rhythm.  Pulmonary:     Effort: Pulmonary effort is normal.     Breath sounds: Normal breath sounds.  Musculoskeletal:     Cervical back: Neck supple.  Neurological:  Mental Status: He is alert.  Psychiatric:        Mood and Affect: Mood normal.        Behavior: Behavior normal.     ------------------------------------------------------------------------------------------------------------------------------------------------------------------------------------------------------------------- Assessment and Plan  Type 2 diabetes mellitus without complication, without long-term current use of insulin (HCC) Diabetes is well controlled with Rybelsus.  Continue at current strength.    Essential hypertension Blood pressure is elevated today .  He will resume amlodipine at 10mg  each  day.  Recheck BP in 1 week.  Reduction in sodium intake is highly encouraged.     Meds ordered this encounter  Medications   DISCONTD: Semaglutide (RYBELSUS) 7 MG TABS    Sig: Take 1 tablet (7 mg total) by mouth daily.    Dispense:  90 tablet    Refill:  1   DISCONTD: amLODipine (NORVASC) 10 MG tablet    Sig: Take 1 tablet (10 mg total) by mouth daily.    Dispense:  90 tablet    Refill:  1   amLODipine (NORVASC) 10 MG tablet    Sig: Take 1 tablet (10 mg total) by mouth daily.    Dispense:  90 tablet    Refill:  1   Semaglutide (RYBELSUS) 7 MG TABS    Sig: Take 1 tablet (7 mg total) by mouth daily.    Dispense:  90 tablet    Refill:  1    Return in about 6 months (around 12/19/2023).    This visit occurred during the SARS-CoV-2 public health emergency.  Safety protocols were in place, including screening questions prior to the visit, additional usage of staff PPE, and extensive cleaning of exam room while observing appropriate contact time as indicated for disinfecting solutions.

## 2023-06-18 NOTE — Assessment & Plan Note (Signed)
Blood pressure is elevated today .  He will resume amlodipine at 10mg  each day.  Recheck BP in 1 week.  Reduction in sodium intake is highly encouraged.

## 2023-06-18 NOTE — Assessment & Plan Note (Signed)
Diabetes is well controlled with Rybelsus.  Continue at current strength.

## 2023-07-01 ENCOUNTER — Other Ambulatory Visit: Payer: Self-pay | Admitting: Family Medicine

## 2023-07-01 ENCOUNTER — Other Ambulatory Visit (HOSPITAL_BASED_OUTPATIENT_CLINIC_OR_DEPARTMENT_OTHER): Payer: Self-pay

## 2023-07-01 DIAGNOSIS — E119 Type 2 diabetes mellitus without complications: Secondary | ICD-10-CM

## 2023-07-01 MED ORDER — RYBELSUS 14 MG PO TABS
14.0000 mg | ORAL_TABLET | Freq: Every day | ORAL | 1 refills | Status: DC
  Filled 2023-07-01: qty 90, 90d supply, fill #0
  Filled 2023-07-17 – 2023-10-02 (×2): qty 30, 30d supply, fill #0
  Filled 2024-01-03: qty 30, 30d supply, fill #1

## 2023-07-17 ENCOUNTER — Other Ambulatory Visit: Payer: Self-pay

## 2023-07-17 ENCOUNTER — Other Ambulatory Visit (HOSPITAL_BASED_OUTPATIENT_CLINIC_OR_DEPARTMENT_OTHER): Payer: Self-pay

## 2023-07-17 ENCOUNTER — Telehealth: Payer: Self-pay

## 2023-07-17 NOTE — Telephone Encounter (Signed)
Initiated Prior authorization [id number] 14MG  tablets Via: Covermymeds Case/Key:[id number] Status: approved  as of 07/17/23 Reason: Notified Pt via: Mychart

## 2023-07-18 ENCOUNTER — Other Ambulatory Visit (HOSPITAL_BASED_OUTPATIENT_CLINIC_OR_DEPARTMENT_OTHER): Payer: Self-pay

## 2023-07-30 ENCOUNTER — Other Ambulatory Visit (HOSPITAL_BASED_OUTPATIENT_CLINIC_OR_DEPARTMENT_OTHER): Payer: Self-pay

## 2023-08-29 ENCOUNTER — Other Ambulatory Visit: Payer: Self-pay | Admitting: Family Medicine

## 2023-08-29 ENCOUNTER — Other Ambulatory Visit (HOSPITAL_BASED_OUTPATIENT_CLINIC_OR_DEPARTMENT_OTHER): Payer: Self-pay

## 2023-08-29 MED ORDER — DOXYCYCLINE HYCLATE 100 MG PO TABS
100.0000 mg | ORAL_TABLET | Freq: Two times a day (BID) | ORAL | 0 refills | Status: DC
Start: 2023-08-29 — End: 2024-04-15
  Filled 2023-08-29: qty 20, 10d supply, fill #0

## 2023-09-11 ENCOUNTER — Ambulatory Visit: Payer: 59 | Admitting: Sports Medicine

## 2023-09-11 DIAGNOSIS — M542 Cervicalgia: Secondary | ICD-10-CM

## 2023-09-11 MED ORDER — KETOROLAC TROMETHAMINE 30 MG/ML IJ SOLN
30.0000 mg | Freq: Once | INTRAMUSCULAR | Status: AC
  Administered 2023-09-11: 30 mg via INTRAMUSCULAR

## 2023-09-11 MED ORDER — METHYLPREDNISOLONE SODIUM SUCC 125 MG IJ SOLR
125.0000 mg | Freq: Once | INTRAMUSCULAR | Status: AC
  Administered 2023-09-11: 125 mg via INTRAMUSCULAR

## 2023-09-11 NOTE — Progress Notes (Signed)
    Procedures performed today:    None.  Independent interpretation of notes and tests performed by another provider:   None.  Brief History, Exam, Impression, and Recommendations:    Neck pain on left side New onset left-sided trapezial pain, nothing radicular, no red flag symptoms, Toradol 30, Solu-Medrol 125 intramuscular, he will do home cervical conditioning exercises, he can return to see me 6 weeks as needed.    ____________________________________________ Ihor Austin. Benjamin Stain, M.D., ABFM., CAQSM., AME. Primary Care and Sports Medicine [facility]  Adjunct Professor of Family Medicine  [employer]  Restaurant manager, fast food

## 2023-09-11 NOTE — Assessment & Plan Note (Signed)
New onset left-sided trapezial pain, nothing radicular, no red flag symptoms, Toradol 30, Solu-Medrol 125 intramuscular, he will do home cervical conditioning exercises, he can return to see me 6 weeks as needed.

## 2023-10-02 ENCOUNTER — Other Ambulatory Visit: Payer: Self-pay

## 2023-10-02 ENCOUNTER — Other Ambulatory Visit (HOSPITAL_BASED_OUTPATIENT_CLINIC_OR_DEPARTMENT_OTHER): Payer: Self-pay

## 2023-10-26 ENCOUNTER — Encounter (HOSPITAL_BASED_OUTPATIENT_CLINIC_OR_DEPARTMENT_OTHER): Payer: Self-pay

## 2023-10-26 ENCOUNTER — Emergency Department (HOSPITAL_BASED_OUTPATIENT_CLINIC_OR_DEPARTMENT_OTHER)
Admission: EM | Admit: 2023-10-26 | Discharge: 2023-10-26 | Disposition: A | Discharge status: Home / Self Care | Payer: 59 | Attending: Emergency Medicine | Admitting: Emergency Medicine

## 2023-10-26 ENCOUNTER — Other Ambulatory Visit: Payer: Self-pay

## 2023-10-26 DIAGNOSIS — M545 Low back pain, unspecified: Secondary | ICD-10-CM | POA: Insufficient documentation

## 2023-10-26 DIAGNOSIS — M5459 Other low back pain: Secondary | ICD-10-CM | POA: Diagnosis not present

## 2023-10-26 MED ORDER — METHOCARBAMOL 500 MG PO TABS: 500.0000 mg | ORAL_TABLET | Freq: Two times a day (BID) | ORAL | 0 refills | Status: DC

## 2023-10-26 MED ORDER — CELECOXIB 200 MG PO CAPS: 200.0000 mg | ORAL_CAPSULE | Freq: Two times a day (BID) | ORAL | 0 refills | Status: DC

## 2023-10-26 MED ORDER — ACETAMINOPHEN 500 MG PO TABS
1000.0000 mg | ORAL_TABLET | Freq: Once | ORAL | Status: AC
Start: 2023-10-26 — End: 2023-10-26
  Administered 2023-10-26: 1000 mg via ORAL
  Filled 2023-10-26: qty 2

## 2023-10-26 MED ORDER — KETOROLAC TROMETHAMINE 15 MG/ML IJ SOLN
30.0000 mg | Freq: Once | INTRAMUSCULAR | Status: AC
  Administered 2023-10-26: 30 mg via INTRAMUSCULAR
  Filled 2023-10-26: qty 2

## 2023-10-26 NOTE — ED Provider Notes (Signed)
 [place] [facility] Provider Note   CSN: [id number] Arrival date & time: 10/26/23  1831     History Chief Complaint  Patient presents with   Back Pain    HPI Aaron Park is a 33 y.o. male presenting for right-sided low back pain.  Denies fevers chills nausea vomiting shortness of breath.  Otherwise ambulatory tolerating p.o. intake..   Patient's recorded medical, surgical, social, medication list and allergies were reviewed in the Snapshot window as part of the initial history.   Review of Systems   Review of Systems  Constitutional:  Negative for chills and fever.  HENT:  Negative for ear pain and sore throat.   Eyes:  Negative for pain and visual disturbance.  Respiratory:  Negative for cough and shortness of breath.   Cardiovascular:  Negative for chest pain and palpitations.  Gastrointestinal:  Negative for abdominal pain and vomiting.  Genitourinary:  Negative for dysuria and hematuria.  Musculoskeletal:  Positive for back pain. Negative for arthralgias.  Skin:  Negative for color change and rash.  Neurological:  Negative for seizures and syncope.  All other systems reviewed and are negative.   Physical Exam Updated Vital Signs BP (!) 152/91 (BP Location: Right Arm)   Pulse 89   Temp 98.7 F (37.1 C) (Oral)   Resp 16   Wt 99.3 kg   SpO2 100%   BMI 31.01 kg/m  Physical Exam Vitals and nursing note reviewed.  Constitutional:      General: He is not in acute distress.    Appearance: He is well-developed.  HENT:     Head: Normocephalic and atraumatic.  Eyes:     Conjunctiva/sclera: Conjunctivae normal.  Cardiovascular:     Rate and Rhythm: Normal rate and regular rhythm.     Heart sounds: No murmur heard. Pulmonary:     Effort: Pulmonary effort is normal. No respiratory distress.     Breath sounds: Normal breath sounds.  Abdominal:     Palpations: Abdomen is soft.     Tenderness: There is no abdominal tenderness.   Musculoskeletal:        General: No swelling.     Cervical back: Neck supple.  Skin:    General: Skin is warm and dry.     Capillary Refill: Capillary refill takes less than 2 seconds.  Neurological:     Mental Status: He is alert.  Psychiatric:        Mood and Affect: Mood normal.      ED Course/ Medical Decision Making/ A&P    Procedures Procedures   Medications Ordered in ED Medications  acetaminophen  (TYLENOL ) tablet 1,000 mg (has no administration in time range)  ketorolac  (TORADOL ) 15 MG/ML injection 30 mg (has no administration in time range)   Medical Decision Making:   Aaron Park is a 33 y.o. male who presented to the ED today with acute lower back pain over the past 24 hours, detailed above.    Patient placed on continuous vitals and telemetry monitoring while in ED which was reviewed periodically.   On my initial exam, the pt was with an intact neurologic exam, tolerating ambulation with an antalgic gait and p.o. intake without difficulty.  Patient had no abnormal DTRs, no midline spinal tenderness.  Patient endorsing complete sensation of the perineum.  Patient without episodes of fecal or urinary incontinence.  Patient has no focal neurologic deficits and reassuring vital signs at this time.  No obvious physical abnormality or injury  on exam. Notably, patient denies recent trauma, is afebrile, and denies IVDU.    Reviewed and confirmed nursing documentation for past medical history, family history, social history.    Initial Assessment:   With the patient's presentation of acute back pain in the above setting, most likely diagnosis is musculoskeletal strain. Other diagnoses were considered including (but not limited to) underlying fracture, epidural hematoma, cauda equina syndrome, spinal stenosis, spinal malignancy. These are considered less likely due to history of present illness and physical exam findings.   In particular, lack of fever, substantial history  of IV drug use, or substantial neurologic abnormality is less consistent with epidural abscess versus discitis or other spinal infection.  Initial Plan:  Multimodal pain control described and patient informed on safe usage.  Screening evaluation grossly unremarkable at this time. Patient stable for continued outpatient evaluation and management of their musculoskeletal pains.  Patient referred back to primary care provider for continued evaluation and management.  Disposition:   Based on the above findings, I believe patient is stable for discharge.    Patient and family educated about specific return precautions for given chief complaint and symptoms.  Patient and family educated about follow-up with PCP.  Patient and family expressed understanding of return precautions and need for follow-up. Patient spoken to regarding all imaging and laboratory results and appropriate follow up for these results. All education provided in verbal and written form and time was allowed for answering of patient questions. Patient discharged.          Emergency Department Medication Summary:   Medications  acetaminophen  (TYLENOL ) tablet 1,000 mg (has no administration in time range)  ketorolac  (TORADOL ) 15 MG/ML injection 30 mg (has no administration in time range)     Clinical Impression:  1. Acute right-sided low back pain without sciatica      Discharge   Final Clinical Impression(s) / ED Diagnoses Final diagnoses:  Acute right-sided low back pain without sciatica    Rx / DC Orders ED Discharge Orders          Ordered    celecoxib  (CELEBREX ) 200 MG capsule  2 times daily        10/26/23 1959    methocarbamol  (ROBAXIN ) 500 MG tablet  2 times daily        10/26/23 1959              Jerral Meth, MD 10/26/23 2001
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
## 2023-10-26 NOTE — ED Triage Notes (Addendum)
 Pt arrives with c/o lower back pain that started this morning. Pt denies urinary symptoms, numbness/tingling, leg pain, or incontinence. Per pt, he has flare episode of his lower back pain sometimes. Pt ambulatory to triage

## 2024-01-03 ENCOUNTER — Other Ambulatory Visit: Payer: Self-pay | Admitting: Family Medicine

## 2024-01-03 DIAGNOSIS — I10 Essential (primary) hypertension: Secondary | ICD-10-CM

## 2024-01-05 ENCOUNTER — Other Ambulatory Visit (HOSPITAL_BASED_OUTPATIENT_CLINIC_OR_DEPARTMENT_OTHER): Payer: Self-pay

## 2024-01-06 ENCOUNTER — Other Ambulatory Visit (HOSPITAL_BASED_OUTPATIENT_CLINIC_OR_DEPARTMENT_OTHER): Payer: Self-pay

## 2024-01-07 ENCOUNTER — Other Ambulatory Visit (HOSPITAL_BASED_OUTPATIENT_CLINIC_OR_DEPARTMENT_OTHER): Payer: Self-pay

## 2024-01-07 MED ORDER — AMLODIPINE BESYLATE 10 MG PO TABS
10.0000 mg | ORAL_TABLET | Freq: Every day | ORAL | 0 refills | Status: DC
  Filled 2024-01-07: qty 30, 30d supply, fill #0
  Filled 2024-02-09 – 2024-02-23 (×2): qty 30, 30d supply, fill #1

## 2024-01-07 NOTE — Telephone Encounter (Signed)
Called patient, Aaron Park

## 2024-01-07 NOTE — Telephone Encounter (Signed)
 Pls contact pt to schedule past due 6-mth follow-up with Dr. Ashley Royalty per OV note: Return in about 6 months (around 12/19/2023). Thx

## 2024-01-08 ENCOUNTER — Other Ambulatory Visit (HOSPITAL_BASED_OUTPATIENT_CLINIC_OR_DEPARTMENT_OTHER): Payer: Self-pay

## 2024-01-09 ENCOUNTER — Other Ambulatory Visit (HOSPITAL_BASED_OUTPATIENT_CLINIC_OR_DEPARTMENT_OTHER): Payer: Self-pay

## 2024-01-12 ENCOUNTER — Other Ambulatory Visit (HOSPITAL_BASED_OUTPATIENT_CLINIC_OR_DEPARTMENT_OTHER): Payer: Self-pay

## 2024-01-13 ENCOUNTER — Other Ambulatory Visit (HOSPITAL_BASED_OUTPATIENT_CLINIC_OR_DEPARTMENT_OTHER): Payer: Self-pay

## 2024-01-14 ENCOUNTER — Other Ambulatory Visit (HOSPITAL_BASED_OUTPATIENT_CLINIC_OR_DEPARTMENT_OTHER): Payer: Self-pay

## 2024-01-15 ENCOUNTER — Emergency Department (HOSPITAL_BASED_OUTPATIENT_CLINIC_OR_DEPARTMENT_OTHER)
Admission: EM | Admit: 2024-01-15 | Discharge: 2024-01-15 | Disposition: A | Discharge status: Home / Self Care | Source: Home / Self Care | Attending: Emergency Medicine | Admitting: Emergency Medicine

## 2024-01-15 ENCOUNTER — Encounter (HOSPITAL_BASED_OUTPATIENT_CLINIC_OR_DEPARTMENT_OTHER): Payer: Self-pay

## 2024-01-15 ENCOUNTER — Emergency Department (HOSPITAL_BASED_OUTPATIENT_CLINIC_OR_DEPARTMENT_OTHER)
Admission: EM | Admit: 2024-01-15 | Discharge: 2024-01-15 | Disposition: A | Discharge status: Home / Self Care | Attending: Emergency Medicine | Admitting: Emergency Medicine

## 2024-01-15 ENCOUNTER — Other Ambulatory Visit (HOSPITAL_BASED_OUTPATIENT_CLINIC_OR_DEPARTMENT_OTHER): Payer: Self-pay

## 2024-01-15 ENCOUNTER — Other Ambulatory Visit: Payer: Self-pay

## 2024-01-15 DIAGNOSIS — M5440 Lumbago with sciatica, unspecified side: Secondary | ICD-10-CM | POA: Insufficient documentation

## 2024-01-15 DIAGNOSIS — Z79899 Other long term (current) drug therapy: Secondary | ICD-10-CM | POA: Insufficient documentation

## 2024-01-15 DIAGNOSIS — M545 Low back pain, unspecified: Secondary | ICD-10-CM | POA: Diagnosis present

## 2024-01-15 DIAGNOSIS — M5442 Lumbago with sciatica, left side: Secondary | ICD-10-CM | POA: Insufficient documentation

## 2024-01-15 DIAGNOSIS — G8929 Other chronic pain: Secondary | ICD-10-CM | POA: Diagnosis not present

## 2024-01-15 DIAGNOSIS — M5441 Lumbago with sciatica, right side: Secondary | ICD-10-CM | POA: Diagnosis not present

## 2024-01-15 MED ORDER — CYCLOBENZAPRINE HCL 10 MG PO TABS
10.0000 mg | ORAL_TABLET | Freq: Once | ORAL | Status: AC
  Administered 2024-01-15: 10 mg via ORAL
  Filled 2024-01-15: qty 1

## 2024-01-15 MED ORDER — ACETAMINOPHEN 500 MG PO TABS
1000.0000 mg | ORAL_TABLET | Freq: Once | ORAL | Status: AC
  Administered 2024-01-15: 1000 mg via ORAL
  Filled 2024-01-15: qty 2

## 2024-01-15 MED ORDER — HYDROMORPHONE HCL 1 MG/ML IJ SOLN: 1.0000 mg | Freq: Once | INTRAMUSCULAR | Status: DC

## 2024-01-15 MED ORDER — KETOROLAC TROMETHAMINE 60 MG/2ML IM SOLN
30.0000 mg | Freq: Once | INTRAMUSCULAR | Status: AC
Start: 2024-01-15 — End: 2024-01-15
  Administered 2024-01-15: 30 mg via INTRAMUSCULAR
  Filled 2024-01-15: qty 2

## 2024-01-15 MED ORDER — KETOROLAC TROMETHAMINE 60 MG/2ML IM SOLN
30.0000 mg | Freq: Once | INTRAMUSCULAR | Status: AC
  Administered 2024-01-15: 30 mg via INTRAMUSCULAR
  Filled 2024-01-15: qty 2

## 2024-01-15 MED ORDER — CELECOXIB 200 MG PO CAPS
200.0000 mg | ORAL_CAPSULE | Freq: Two times a day (BID) | ORAL | 0 refills | Status: DC
  Filled 2024-01-15: qty 20, 10d supply, fill #0

## 2024-01-15 MED ORDER — METHYLPREDNISOLONE 4 MG PO TBPK: ORAL_TABLET | ORAL | 0 refills | Status: DC

## 2024-01-15 MED ORDER — LIDOCAINE 5 % EX PTCH
1.0000 | MEDICATED_PATCH | CUTANEOUS | Status: DC
  Administered 2024-01-15: 1 via TRANSDERMAL
  Filled 2024-01-15: qty 1

## 2024-01-15 MED ORDER — LIDOCAINE 5 % EX PTCH
1.0000 | MEDICATED_PATCH | CUTANEOUS | 0 refills | Status: AC
  Filled 2024-01-15: qty 30, 30d supply, fill #0

## 2024-01-15 MED ORDER — CYCLOBENZAPRINE HCL 10 MG PO TABS: 10.0000 mg | ORAL_TABLET | Freq: Two times a day (BID) | ORAL | 0 refills | Status: DC | PRN

## 2024-01-15 NOTE — ED Notes (Signed)
 Pt endorsed localized central lumbar back pain, at top of sacrum. No neuro radiation.

## 2024-01-15 NOTE — ED Triage Notes (Addendum)
 Left buttock pain started today, does not radiate down leg Was seen here earlier today for back pain and that has resolved

## 2024-01-15 NOTE — ED Provider Notes (Signed)
 [place] [facility] Provider Note   CSN: [id number] Arrival date & time: 01/15/24  2119     History  Chief Complaint  Patient presents with   left buttock pain    Aaron Park is a 33 y.o. male.  Is here for ongoing low back pain.  He was seen earlier in the day he is actually feeling better upper back pain relief but still having some sciatic pain in the left gluteal area.  He took his last dose of anti-inflammatory about 12 hours ago.  He has been using lidocaine patches.  He denies any loss of bowel or bladder.  Denies any trauma.  Denies any fevers or chills.  He is really trying to work tomorrow and was hoping to see if he can get a little bit more relief tonight.  He denies any weakness numbness tingling.  No chest pain shortness of breath.  History of the same.  The history is provided by the patient.       Home Medications Prior to Admission medications   Medication Sig Start Date End Date Taking? Authorizing Provider  cyclobenzaprine (FLEXERIL) 10 MG tablet Take 1 tablet (10 mg total) by mouth 2 (two) times daily as needed for muscle spasms. 01/15/24  Yes Lekendrick Alpern, DO  methylPREDNISolone (MEDROL DOSEPAK) 4 MG TBPK tablet Follow package insert 01/15/24  Yes Havard Radigan, DO  amLODipine (NORVASC) 10 MG tablet Take 1 tablet (10 mg total) by mouth daily. 01/07/24   Everrett Coombe, DO  celecoxib (CELEBREX) 200 MG capsule Take 1 capsule (200 mg total) by mouth 2 (two) times daily. 01/15/24   Horton, Clabe Seal, DO  doxycycline (VIBRA-TABS) 100 MG tablet Take 1 tablet (100 mg total) by mouth 2 (two) times daily. 08/29/23   Everrett Coombe, DO  lidocaine (LIDODERM) 5 % Place 1 patch onto the skin daily. Remove & Discard patch within 12 hours or as directed by MD. 01/15/24   Horton, Clabe Seal, DO  methocarbamol (ROBAXIN) 500 MG tablet Take 1 tablet (500 mg total) by mouth 2 (two) times daily. 10/26/23   Glyn Ade, MD  Semaglutide  (RYBELSUS) 14 MG TABS Take 1 tablet (14 mg total) by mouth daily. 07/01/23   Everrett Coombe, DO      Allergies    Patient has no known allergies.    Review of Systems   Review of Systems  Physical Exam Updated Vital Signs BP 139/83 (BP Location: Right Arm)   Pulse (!) 106   Temp 98.9 F (37.2 C) (Oral)   Resp (!) 22   Ht 5\' 11"  (1.803 m)   SpO2 96%   BMI 31.38 kg/m  Physical Exam Vitals and nursing note reviewed.  Constitutional:      General: He is not in acute distress.    Appearance: He is well-developed.  HENT:     Head: Normocephalic and atraumatic.  Eyes:     Extraocular Movements: Extraocular movements intact.     Conjunctiva/sclera: Conjunctivae normal.     Pupils: Pupils are equal, round, and reactive to light.  Cardiovascular:     Rate and Rhythm: Normal rate and regular rhythm.     Heart sounds: No murmur heard. Pulmonary:     Effort: Pulmonary effort is normal. No respiratory distress.     Breath sounds: Normal breath sounds.  Abdominal:     Palpations: Abdomen is soft.     Tenderness: There is no abdominal tenderness.  Musculoskeletal:  General: Tenderness (TTP to left lower paraspinal muscles and gluteal region) present. No swelling.     Cervical back: Neck supple.  Skin:    General: Skin is warm and dry.     Capillary Refill: Capillary refill takes less than 2 seconds.  Neurological:     General: No focal deficit present.     Mental Status: He is alert and oriented to person, place, and time.     Cranial Nerves: No cranial nerve deficit.     Sensory: No sensory deficit.     Motor: No weakness.  Psychiatric:        Mood and Affect: Mood normal.     ED Results / Procedures / Treatments   Labs (all labs ordered are listed, but only abnormal results are displayed) Labs Reviewed - No data to display  EKG None  Radiology No results found.  Procedures Procedures    Medications Ordered in ED Medications  ketorolac (TORADOL)  injection 30 mg (has no administration in time range)  cyclobenzaprine (FLEXERIL) tablet 10 mg (has no administration in time range)    ED Course/ Medical Decision Making/ A&P                                 Medical Decision Making Risk Prescription drug management.   Aaron Park is here with ongoing low back pain.  This seems sciatic in nature.  He is having pain that goes into the left gluteal area but does not really go down the leg.  He is neurovascular neuromuscular intact on exam.  He has no red flag symptoms.  No symptoms of cauda equina.  No fever no chills.  Have no concern for infectious process.  History of back pain.  Overall after discussion we will have him hold Celebrex and do a Medrol Dosepak he states he has done better with that in the past.  Will continue lidocaine patches will add Flexeril.  Recommend Tylenol as well.  Ultimately I do not think he needs any imaging or any further workup.  He was given a Toradol shot in the ED and Flexeril.  He will continue Flexeril lidocaine patches and start Medrol Dosepak tonight.  Discharged in good condition.  Understands return precautions.  This chart was dictated using voice recognition software.  Despite best efforts to proofread,  errors can occur which can change the documentation meaning.         Final Clinical Impression(s) / ED Diagnoses Final diagnoses:  Acute left-sided low back pain with sciatica, sciatica laterality unspecified    Rx / DC Orders ED Discharge Orders          Ordered    methylPREDNISolone (MEDROL DOSEPAK) 4 MG TBPK tablet        01/15/24 2140    cyclobenzaprine (FLEXERIL) 10 MG tablet  2 times daily PRN        01/15/24 2140              Virgina Norfolk, DO 01/15/24 2151

## 2024-01-15 NOTE — ED Provider Notes (Signed)
 [place] [facility] Provider Note   CSN: [id number] Arrival date & time: 01/15/24  9528     History  Chief Complaint  Patient presents with   Back Pain    Aaron Park is a 33 y.o. male.  HPI   33 year old male with chronic right lower musculoskeletal back pain presents emergency department than exacerbation.  Patient states over the past couple days his pain has been worsening, not controlled with over-the-counter medications.  He has frequently had to come to the ER but does well with "a shot of pain medicine" and some oral medication.  Last time he was trialed on muscle relaxers that did not provide a lot of relief.  He states he does well with an extra strength NSAID.  He denies any acute change in the pain.  There is no radiation of pain down the leg, no numbness/weakness, no other red flags reported.  Home Medications Prior to Admission medications   Medication Sig Start Date End Date Taking? Authorizing Provider  lidocaine (LIDODERM) 5 % Place 1 patch onto the skin daily. Remove & Discard patch within 12 hours or as directed by MD. 01/15/24  Yes Camree Wigington, Clabe Seal, DO  amLODipine (NORVASC) 10 MG tablet Take 1 tablet (10 mg total) by mouth daily. 01/07/24   Everrett Coombe, DO  celecoxib (CELEBREX) 200 MG capsule Take 1 capsule (200 mg total) by mouth 2 (two) times daily. 01/15/24   Norval Slaven, Clabe Seal, DO  doxycycline (VIBRA-TABS) 100 MG tablet Take 1 tablet (100 mg total) by mouth 2 (two) times daily. 08/29/23   Everrett Coombe, DO  methocarbamol (ROBAXIN) 500 MG tablet Take 1 tablet (500 mg total) by mouth 2 (two) times daily. 10/26/23   Glyn Ade, MD  Semaglutide (RYBELSUS) 14 MG TABS Take 1 tablet (14 mg total) by mouth daily. 07/01/23   Everrett Coombe, DO      Allergies    Patient has no known allergies.    Review of Systems   Review of Systems  Constitutional:  Negative for fever.  Respiratory:  Negative for shortness of breath.    Cardiovascular:  Negative for chest pain.  Musculoskeletal:  Positive for back pain. Negative for neck pain and neck stiffness.  Skin:  Negative for rash.  Neurological:  Negative for weakness, numbness and headaches.    Physical Exam Updated Vital Signs BP 130/83   Temp 98.2 F (36.8 C) (Oral)   Resp 18   Ht 5\' 11"  (1.803 m)   Wt 102.1 kg   SpO2 100%   BMI 31.38 kg/m  Physical Exam Vitals and nursing note reviewed.  Constitutional:      Appearance: Normal appearance.  HENT:     Head: Normocephalic.     Mouth/Throat:     Mouth: Mucous membranes are moist.  Cardiovascular:     Rate and Rhythm: Normal rate.  Pulmonary:     Effort: Pulmonary effort is normal. No respiratory distress.  Abdominal:     Palpations: Abdomen is soft.     Tenderness: There is no abdominal tenderness.  Musculoskeletal:     Comments: Mild tenderness to palpation of the right lumbar lateral musculature, no midline spinal tenderness, negative straight leg test, lower extremities are neurovascularly intact  Skin:    General: Skin is warm.  Neurological:     Mental Status: He is alert and oriented to person, place, and time. Mental status is at baseline.  Psychiatric:  Mood and Affect: Mood normal.     ED Results / Procedures / Treatments   Labs (all labs ordered are listed, but only abnormal results are displayed) Labs Reviewed - No data to display  EKG None  Radiology No results found.  Procedures Procedures    Medications Ordered in ED Medications  lidocaine (LIDODERM) 5 % 1 patch (1 patch Transdermal Patch Applied 01/15/24 0946)  ketorolac (TORADOL) injection 30 mg (30 mg Intramuscular Given 01/15/24 0944)  acetaminophen (TYLENOL) tablet 1,000 mg (1,000 mg Oral Given 01/15/24 5366)    ED Course/ Medical Decision Making/ A&P                                 Medical Decision Making Risk OTC drugs. Prescription drug management.   33 year old male presents emergency  department acute on chronic right lower lateral musculoskeletal back pain.  This is the baseline discomfort that he has dealt with, no acute changes or other red flag symptoms.  Pain is reproducible, appears very musculoskeletal.  No other systemic symptoms.  After treatment here in the department he feels improved, will discharge with medicine for symptomatic control and outpatient referral to orthopedic/spine.  Patient at this time appears safe and stable for discharge and close outpatient follow up. Discharge plan and strict return to ED precautions discussed, patient verbalizes understanding and agreement.        Final Clinical Impression(s) / ED Diagnoses Final diagnoses:  Chronic right-sided low back pain without sciatica    Rx / DC Orders ED Discharge Orders          Ordered    lidocaine (LIDODERM) 5 %  Every 24 hours        01/15/24 0931    celecoxib (CELEBREX) 200 MG capsule  2 times daily        01/15/24 0931              Javeria Briski, Clabe Seal, DO 01/15/24 1032

## 2024-01-15 NOTE — Discharge Instructions (Signed)
 Continue 1000 mg of Tylenol every 6 hours as needed for pain.  I would do Medrol Dosepak over Celebrex at this time and then can use Celebrex after you are done with your Medrol Dosepak as needed.  Recommend lidocaine patches.  Flexeril is a muscle relaxant that I provided you as well.  This medication is sedating so do not mix with alcohol drugs or dangerous activities including driving.

## 2024-01-15 NOTE — Discharge Instructions (Addendum)
 You have been seen and discharged from the emergency department.  You were treated with a shot of pain medicine and a pain patch.  Take anti-inflammatory pain medicine prescription as prescribed with food.  Use pain patches as directed.  Follow-up with your primary provider for further evaluation and further care. Take home medications as prescribed. If you have any worsening symptoms or further concerns for your health please return to an emergency department for further evaluation.

## 2024-01-15 NOTE — ED Notes (Signed)

## 2024-01-15 NOTE — ED Triage Notes (Signed)
 Pt states that he is have a back pain flare up. States that the pain began on Monday. States that he has gotten worse since then. States that he is barely able to move.

## 2024-01-16 ENCOUNTER — Other Ambulatory Visit (HOSPITAL_BASED_OUTPATIENT_CLINIC_OR_DEPARTMENT_OTHER): Payer: Self-pay

## 2024-01-19 ENCOUNTER — Other Ambulatory Visit (HOSPITAL_BASED_OUTPATIENT_CLINIC_OR_DEPARTMENT_OTHER): Payer: Self-pay

## 2024-01-20 ENCOUNTER — Other Ambulatory Visit (HOSPITAL_BASED_OUTPATIENT_CLINIC_OR_DEPARTMENT_OTHER): Payer: Self-pay

## 2024-01-20 ENCOUNTER — Telehealth: Payer: Self-pay

## 2024-01-20 ENCOUNTER — Other Ambulatory Visit (HOSPITAL_COMMUNITY): Payer: Self-pay

## 2024-01-20 NOTE — Telephone Encounter (Signed)
 Pharmacy Patient Advocate Encounter   Received notification from CoverMyMeds that prior authorization for Rybelsus 14 is required/requested.   Insurance verification completed.   The patient is insured through [facility] .   Per test claim: PA required; PA submitted to above mentioned insurance via CoverMyMeds Key/confirmation #/EOC [id number] Status is pending

## 2024-01-21 ENCOUNTER — Other Ambulatory Visit (HOSPITAL_COMMUNITY): Payer: Self-pay

## 2024-01-21 NOTE — Telephone Encounter (Signed)
 Pharmacy Patient Advocate Encounter  Received notification from [facility] that Prior Authorization for Rybelsus 14 has been APPROVED from 01/20/24 to 01/19/25. Unable to obtain price due to refill too soon rejection, last fill date 01/20/24 next available fill date4/24/25   PA #/Case ID/Reference #: [id number]

## 2024-01-27 ENCOUNTER — Other Ambulatory Visit: Payer: Self-pay

## 2024-01-27 ENCOUNTER — Encounter (HOSPITAL_BASED_OUTPATIENT_CLINIC_OR_DEPARTMENT_OTHER): Payer: Self-pay | Admitting: Emergency Medicine

## 2024-01-27 ENCOUNTER — Emergency Department (HOSPITAL_BASED_OUTPATIENT_CLINIC_OR_DEPARTMENT_OTHER)
Admission: EM | Admit: 2024-01-27 | Discharge: 2024-01-28 | Disposition: A | Discharge status: Home / Self Care | Attending: Emergency Medicine | Admitting: Emergency Medicine

## 2024-01-27 ENCOUNTER — Encounter: Payer: Self-pay | Admitting: Sports Medicine

## 2024-01-27 ENCOUNTER — Emergency Department (HOSPITAL_COMMUNITY)

## 2024-01-27 DIAGNOSIS — M5432 Sciatica, left side: Secondary | ICD-10-CM

## 2024-01-27 DIAGNOSIS — M5416 Radiculopathy, lumbar region: Secondary | ICD-10-CM | POA: Insufficient documentation

## 2024-01-27 DIAGNOSIS — M545 Low back pain, unspecified: Secondary | ICD-10-CM | POA: Diagnosis present

## 2024-01-27 DIAGNOSIS — R202 Paresthesia of skin: Secondary | ICD-10-CM

## 2024-01-27 LAB — CBC
HCT: 41.1 % (ref 39.0–52.0)
Hemoglobin: 13.8 g/dL (ref 13.0–17.0)
MCH: 30.1 pg (ref 26.0–34.0)
MCHC: 33.6 g/dL (ref 30.0–36.0)
MCV: 89.7 fL (ref 80.0–100.0)
Platelets: 283 10*3/uL (ref 150–400)
RBC: 4.58 MIL/uL (ref 4.22–5.81)
RDW: 12.7 % (ref 11.5–15.5)
WBC: 7.1 10*3/uL (ref 4.0–10.5)
nRBC: 0 % (ref 0.0–0.2)

## 2024-01-27 LAB — URINALYSIS, ROUTINE W REFLEX MICROSCOPIC
Bilirubin Urine: NEGATIVE
Glucose, UA: NEGATIVE mg/dL
Hgb urine dipstick: NEGATIVE
Ketones, ur: NEGATIVE mg/dL
Leukocytes,Ua: NEGATIVE
Nitrite: NEGATIVE
Protein, ur: NEGATIVE mg/dL
Specific Gravity, Urine: >=1.03 (ref 1.005–1.030)
pH: 6.5 (ref 5.0–8.0)

## 2024-01-27 LAB — BASIC METABOLIC PANEL WITH GFR
Anion gap: 11 (ref 5–15)
BUN: 15 mg/dL (ref 6–20)
CO2: 25 mmol/L (ref 22–32)
Calcium: 9.4 mg/dL (ref 8.9–10.3)
Chloride: 100 mmol/L (ref 98–111)
Creatinine, Ser: 0.87 mg/dL (ref 0.61–1.24)
GFR, Estimated: >60 mL/min (ref >60)
Glucose, Bld: 149 mg/dL — ABNORMAL HIGH (ref 70–99)
Potassium: 3.7 mmol/L (ref 3.5–5.1)
Sodium: 136 mmol/L (ref 135–145)

## 2024-01-27 NOTE — ED Triage Notes (Signed)
 Pt reports pain d/t sciatica (LLE) for about a week; also reports that during this time he cannot feel when is urinating; amount of urine is normal for him; sciatica pain is somewhat better

## 2024-01-27 NOTE — ED Notes (Signed)
 Pt transferred to [facility] ED  pov for MRI. Pt alert and oriented X 4 at the time of transfer. RR even and unlabored. No acute distress noted. Pt verbalized understanding of transfer instructions as discussed. Pt ambulatory to lobby at time of transfer

## 2024-01-27 NOTE — ED Provider Notes (Signed)
 [facility] Provider Note   CSN: [id number] Arrival date & time: 01/27/24  1459     History  Chief Complaint  Patient presents with   Numbness    Aaron Park is a 33 y.o. male.  Patient with history of intermittent episodes of low back pain, sometimes with radiation into either leg.  Patient has had multiple PCP and emergency department visits for the same.  Most recent episode started at the end of March.  Patient was seen in the emergency department on 2 occasions on March 27.  Patient was given a Medrol Dosepak, IM Toradol, Flexeril next.  This helped improve back pain which has resolved.  However over the past week patient has developed numbness that he has never had before.  This involves the saddle and groin area as well as somewhat into the leg as well.  Patient reports numbness on the lateral aspect of his foot.  He has been walking a little differently and feels subjective weakness in the leg but denies any foot drop or tripping.  Patient also reports a change in sensation when he urinates.  He has not had urinary retention and feels the urge to go but urinating feels different.  He also reports sensation change with ejaculation.  No dysuria, hematuria, increased frequency or urgency.  He has not had fecal incontinence.  Patient works at a dermatology office.  He recently talked to his PCP about some rehab exercises to do at home.  After discussion with some colleagues today, he was encouraged to go to the emergency department due to the urinary symptoms that he is having. Otherwise, patient denies warning symptoms of back pain including: night sweats, waking from sleep with back pain, unexplained fevers or weight loss, h/o cancer, IVDU, recent trauma.          Home Medications Prior to Admission medications   Medication Sig Start Date End Date Taking? Authorizing Provider  amLODipine (NORVASC) 10 MG tablet Take 1 tablet (10 mg  total) by mouth daily. 01/07/24   Everrett Coombe, DO  celecoxib (CELEBREX) 200 MG capsule Take 1 capsule (200 mg total) by mouth 2 (two) times daily. 01/15/24   Horton, Clabe Seal, DO  cyclobenzaprine (FLEXERIL) 10 MG tablet Take 1 tablet (10 mg total) by mouth 2 (two) times daily as needed for muscle spasms. 01/15/24   Curatolo, Adam, DO  doxycycline (VIBRA-TABS) 100 MG tablet Take 1 tablet (100 mg total) by mouth 2 (two) times daily. 08/29/23   Everrett Coombe, DO  lidocaine (LIDODERM) 5 % Place 1 patch onto the skin daily. Remove & Discard patch within 12 hours or as directed by MD. 01/15/24   Horton, Clabe Seal, DO  methocarbamol (ROBAXIN) 500 MG tablet Take 1 tablet (500 mg total) by mouth 2 (two) times daily. 10/26/23   Glyn Ade, MD  methylPREDNISolone (MEDROL DOSEPAK) 4 MG TBPK tablet Follow package insert 01/15/24   Curatolo, Adam, DO  Semaglutide (RYBELSUS) 14 MG TABS Take 1 tablet (14 mg total) by mouth daily. 07/01/23   Everrett Coombe, DO      Allergies    Patient has no known allergies.    Review of Systems   Review of Systems  Physical Exam Updated Vital Signs BP (!) 145/97   Pulse 78   Temp 98.4 F (36.9 C) (Oral)   Resp 15   Ht 5\' 11"  (1.803 m)   Wt 102 kg   SpO2 90%   BMI 31.36  kg/m  Physical Exam Vitals and nursing note reviewed.  Constitutional:      Appearance: He is well-developed.  HENT:     Head: Normocephalic and atraumatic.  Eyes:     Conjunctiva/sclera: Conjunctivae normal.  Abdominal:     Palpations: Abdomen is soft.     Tenderness: There is no abdominal tenderness. There is no right CVA tenderness or left CVA tenderness.  Musculoskeletal:        General: No tenderness. Normal range of motion.     Cervical back: Normal range of motion.     Comments: No step-off noted with palpation of spine.   Skin:    General: Skin is warm and dry.  Neurological:     Mental Status: He is alert.     Sensory: Sensory deficit (Reports saddle paresthesias) present.      Motor: No abnormal muscle tone.     Deep Tendon Reflexes:     Reflex Scores:      Patellar reflexes are 2+ on the right side and 1+ on the left side.    Comments: 5/5 strength in entire lower extremities bilaterally except for 4+/5 strength with dorsiflexion of the left foot.  Patient ambulates in the room with a slight limp but no apparent foot drop.  Psychiatric:        Mood and Affect: Mood normal.     ED Results / Procedures / Treatments   Labs (all labs ordered are listed, but only abnormal results are displayed) Labs Reviewed  URINALYSIS, ROUTINE W REFLEX MICROSCOPIC  CBC  BASIC METABOLIC PANEL WITH GFR    EKG None  Radiology No results found.  Procedures Procedures    Medications Ordered in ED Medications - No data to display  ED Course/ Medical Decision Making/ A&P    Patient seen and examined. History obtained directly from patient. Work-up including labs, imaging, EKG ordered in triage, if performed, were reviewed.    Labs/EKG: Independently reviewed and interpreted.  This included: UA was negative without blood or signs of infection.  Added CBC and BMP.  Imaging: none ordered  Medications/Fluids: None ordered  Most recent vital signs reviewed and are as follows: BP (!) 145/97   Pulse 78   Temp 98.4 F (36.9 C) (Oral)   Resp 15   Ht 5\' 11"  (1.803 m)   Wt 102 kg   SpO2 90%   BMI 31.36 kg/m   Initial impression: Patient with suspected lumbar radiculopathy involving the left leg.  Patient does have some alarm symptoms including saddle paresthesias and while he does not have urinary retention or fecal incontinence, he does report a change in sensation with urination and bowel movements.  Given this, cauda equina concerns are on differential.  For this reason we will be transferred for MRI.    Patient discussed with Dr. Rush Landmark.  Plan for transfer.  Discussed transfer by personal vehicle versus ambulance.  Discussed risks and benefits of each.   Patient is very comfortable going by private vehicle.  Dr. Andria Meuse accepting at [facility].   CBC and BMP sent prior to discharge.  Plan and next provider: MRI lumbar spine ordered to rule out possibility of cauda equina.  If negative, patient may benefit from additional course of steroids, referral to neurosurgery as outpatient.                                Medical Decision Making Amount and/or  Complexity of Data Reviewed Labs: ordered. Radiology: ordered.   Patient with back pain with seemingly progressive sensation deficit.  Patient with longstanding history of back pain but given progression of symptoms, saddle anesthesia and other alarm signs, will be transferred for MRI.  Patient is ambulatory. He elects to go POV.         Final Clinical Impression(s) / ED Diagnoses Final diagnoses:  Lumbar radiculopathy  Paresthesia of saddle area    Rx / DC Orders ED Discharge Orders     None         Renne Crigler, PA-C 01/27/24 1808    Tegeler, Canary Brim, MD 01/27/24 [phone]

## 2024-01-27 NOTE — Discharge Instructions (Signed)
 Please go to [facility] emergency department, main entrance for the emergency department.  Let them know that you were seen at [facility] and are there to have an MRI performed.

## 2024-01-28 ENCOUNTER — Other Ambulatory Visit (HOSPITAL_BASED_OUTPATIENT_CLINIC_OR_DEPARTMENT_OTHER): Payer: Self-pay

## 2024-01-28 MED ORDER — ACETAMINOPHEN 325 MG PO TABS: 650.0000 mg | ORAL_TABLET | Freq: Four times a day (QID) | ORAL | 0 refills | Status: DC | PRN

## 2024-01-28 MED ORDER — PREDNISONE 20 MG PO TABS
ORAL_TABLET | ORAL | 0 refills | Status: DC
  Filled 2024-01-28: qty 27, 15d supply, fill #0

## 2024-01-28 MED ORDER — ACETAMINOPHEN 325 MG PO TABS
650.0000 mg | ORAL_TABLET | Freq: Four times a day (QID) | ORAL | Status: DC | PRN
  Administered 2024-01-28: 650 mg via ORAL
  Filled 2024-01-28: qty 2

## 2024-01-28 MED ORDER — GABAPENTIN 100 MG PO CAPS
100.0000 mg | ORAL_CAPSULE | Freq: Two times a day (BID) | ORAL | 0 refills | Status: AC
  Filled 2024-01-28: qty 60, 30d supply, fill #0

## 2024-01-28 MED ORDER — OXYCODONE HCL 5 MG PO TABS: 5.0000 mg | ORAL_TABLET | Freq: Once | ORAL | Status: DC

## 2024-01-28 MED ORDER — GABAPENTIN 100 MG PO CAPS: 100.0000 mg | ORAL_CAPSULE | Freq: Two times a day (BID) | ORAL | 0 refills | Status: DC

## 2024-01-28 MED ORDER — ACETAMINOPHEN 325 MG PO TABS
650.0000 mg | ORAL_TABLET | Freq: Four times a day (QID) | ORAL | 0 refills | Status: AC | PRN
  Filled 2024-01-28: qty 30, 4d supply, fill #0

## 2024-01-28 MED ORDER — GABAPENTIN 100 MG PO CAPS
100.0000 mg | ORAL_CAPSULE | Freq: Once | ORAL | Status: AC
  Administered 2024-01-28: 100 mg via ORAL
  Filled 2024-01-28: qty 1

## 2024-01-28 MED ORDER — PREDNISONE 20 MG PO TABS
60.0000 mg | ORAL_TABLET | Freq: Once | ORAL | Status: AC
  Administered 2024-01-28: 60 mg via ORAL
  Filled 2024-01-28: qty 3

## 2024-01-28 NOTE — Telephone Encounter (Cosign Needed)
 I was asked to send the patient's prescriptions, prescribed at [facility] last night, to pharmacy here.  I did see the patient yesterday prior to transfer.  He was prescribed Tylenol, prednisone, gabapentin last night.  These were sent in to [facility] pharmacy.

## 2024-01-28 NOTE — ED Provider Notes (Signed)
 Patient sent here for MRI secondary to neuro symptoms and back pain.   On my exam patient ambulates with a mild limp. Decreased sensation to left inner thigh. MRI with central canal stenosis but below the cauda equina. Symptoms likely related to worsening sciatica. D/w Dr. Danielle Dess, agrees with prednisone/neurontin, follow up next week. Patient aware.    Solveig Fangman, Barbara Cower, MD 01/28/24 [phone]

## 2024-02-03 ENCOUNTER — Other Ambulatory Visit (HOSPITAL_BASED_OUTPATIENT_CLINIC_OR_DEPARTMENT_OTHER): Payer: Self-pay

## 2024-02-10 ENCOUNTER — Other Ambulatory Visit (HOSPITAL_BASED_OUTPATIENT_CLINIC_OR_DEPARTMENT_OTHER): Payer: Self-pay

## 2024-02-12 ENCOUNTER — Ambulatory Visit (INDEPENDENT_AMBULATORY_CARE_PROVIDER_SITE_OTHER): Admitting: Sports Medicine

## 2024-02-12 ENCOUNTER — Encounter: Payer: Self-pay | Admitting: Sports Medicine

## 2024-02-12 DIAGNOSIS — M519 Unspecified thoracic, thoracolumbar and lumbosacral intervertebral disc disorder: Secondary | ICD-10-CM

## 2024-02-12 DIAGNOSIS — M5116 Intervertebral disc disorders with radiculopathy, lumbar region: Secondary | ICD-10-CM | POA: Insufficient documentation

## 2024-02-12 DIAGNOSIS — R29898 Other symptoms and signs involving the musculoskeletal system: Secondary | ICD-10-CM | POA: Insufficient documentation

## 2024-02-12 NOTE — Progress Notes (Signed)
    Procedures performed today:    None.  Independent interpretation of notes and tests performed by another provider:   I did personally review his lumbar spine MRI, there are several levels with disc protrusions, he does have a very large disc extrusion at the L5-S1 level that appears to obliterate the entire spinal canal.  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  More recently he began to develop some difficulty feeling himself void, as well as some numbness in the left side of his perineum, weakness in the left leg with tripping. He went to the ER and an urgent MRI was obtained, this did show a very large disc extrusion with disc material cranial and caudal to the L5-S1 level essentially completely obliterating the spinal canal. Telephone consultation was provided to Dr. Ellery Guthrie, as some of his urinary symptoms were improving as recommended outpatient care. Aaron Park presents to me today, he recalls a similar story, several months of increasing axial low back pain with radiation down the left leg, progressive loss of the ability to sense himself voiding and numbness in the left perineum. He has had some steroids and since then has regained some of the sense of voiding but still has some left perineal numbness, he also has paresthesias down the left leg to the middle 3 toes. On exam he walks with a limp, he has subjective weakness to dorsiflexion of the left ankle compared to the contralateral side. There is also some objective paresthesia in an L5 distribution on the left. I did personally review his lumbar spine MRI, there are several levels with disc protrusions, he does have a very large disc extrusion at the  L5-S1 level that appears to obliterate the entire spinal canal. There is no evidence of spondylolisthesis. I am concerned that he did have cauda equina syndrome, and that he is experiencing a temporary improvement. He will likely develop recurrence and I do think he needs urgent surgical evaluation. 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 Whitman Hampshire with the [facility], and he will see the patient today at about 2:30. I spoke to their office and made sure he was on their schedule and I spoke to the patient and he will be there around 2 PM today.  He does have greater than 4 metabolic equivalents of cardiac capacity, well controlled diabetes, and he would be medically cleared for intermediate risk noncardiac surgery.  I spent 40 minutes of total time managing this patient today, this includes chart review, face to face, and non-face to face time.  We considered admission today.  ____________________________________________ Joselyn Nicely. Sandy Crumb, M.D., ABFM., CAQSM., AME. Primary Care and Sports Medicine [facility]  Adjunct Professor of [facility] Medicine  [employer]  Restaurant manager, fast food

## 2024-02-12 NOTE — Assessment & Plan Note (Addendum)
 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  More recently he began to develop some difficulty feeling himself void, as well as some numbness in the left side of his perineum, weakness in the left leg with tripping. He went to the ER and an urgent MRI was obtained, this did show a very large disc extrusion with disc material cranial and caudal to the L5-S1 level essentially completely obliterating the spinal canal. Telephone consultation was provided to Dr. Ellery Park, as some of his urinary symptoms were improving as recommended outpatient care. Aaron Park presents to me today, he recalls a similar story, several months of increasing axial low back pain with radiation down the left leg, progressive loss of the ability to sense himself voiding and numbness in the left perineum. He has had some steroids and since then has regained some of the sense of voiding but still has some left perineal numbness, he also has paresthesias down the left leg to the middle 3 toes. On exam he walks with a limp, he has subjective weakness to dorsiflexion of the left ankle compared to the contralateral side. There is also some objective paresthesia in an L5 distribution on the left. I did personally review his lumbar spine MRI, there are several levels with disc protrusions, he does have a very large disc extrusion at the L5-S1 level that appears to obliterate the entire spinal canal. There is no evidence of spondylolisthesis. I am concerned that he did have cauda equina syndrome, and that he is experiencing a temporary improvement. He will likely develop recurrence and I do think he needs urgent surgical evaluation. 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 Whitman Park with the [facility], and he will see the patient today at about 2:30. I spoke to their office and made sure he was on their schedule and I spoke to the patient and he will be there around 2 PM today.  He does have greater than 4 metabolic equivalents of cardiac capacity, well controlled diabetes, and he would be medically cleared for intermediate risk noncardiac surgery.

## 2024-02-20 ENCOUNTER — Other Ambulatory Visit (HOSPITAL_BASED_OUTPATIENT_CLINIC_OR_DEPARTMENT_OTHER): Payer: Self-pay

## 2024-02-24 ENCOUNTER — Other Ambulatory Visit (HOSPITAL_BASED_OUTPATIENT_CLINIC_OR_DEPARTMENT_OTHER): Payer: Self-pay

## 2024-03-01 DIAGNOSIS — Z9889 Other specified postprocedural states: Secondary | ICD-10-CM | POA: Insufficient documentation

## 2024-04-13 DIAGNOSIS — M5386 Other specified dorsopathies, lumbar region: Secondary | ICD-10-CM | POA: Insufficient documentation

## 2024-04-13 DIAGNOSIS — M6289 Other specified disorders of muscle: Secondary | ICD-10-CM | POA: Insufficient documentation

## 2024-04-13 DIAGNOSIS — R531 Weakness: Secondary | ICD-10-CM | POA: Insufficient documentation

## 2024-04-15 ENCOUNTER — Ambulatory Visit: Admitting: Family Medicine

## 2024-04-15 ENCOUNTER — Encounter: Payer: Self-pay | Admitting: Family Medicine

## 2024-04-15 ENCOUNTER — Other Ambulatory Visit (HOSPITAL_BASED_OUTPATIENT_CLINIC_OR_DEPARTMENT_OTHER): Payer: Self-pay

## 2024-04-15 VITALS — BP 146/87 | HR 107 | Resp 18 | Ht 70.47 in | Wt 241.2 lb

## 2024-04-15 DIAGNOSIS — Z0000 Encounter for general adult medical examination without abnormal findings: Secondary | ICD-10-CM

## 2024-04-15 DIAGNOSIS — I10 Essential (primary) hypertension: Secondary | ICD-10-CM | POA: Diagnosis not present

## 2024-04-15 DIAGNOSIS — E119 Type 2 diabetes mellitus without complications: Secondary | ICD-10-CM

## 2024-04-15 LAB — POCT GLYCOSYLATED HEMOGLOBIN (HGB A1C): Hemoglobin A1C: 6.6 % — AB (ref 4.0–5.6)

## 2024-04-15 MED ORDER — AMLODIPINE BESYLATE 10 MG PO TABS
10.0000 mg | ORAL_TABLET | Freq: Every day | ORAL | 0 refills | Status: DC
  Filled 2024-04-15: qty 90, 90d supply, fill #0

## 2024-04-15 MED ORDER — AMLODIPINE BESYLATE 10 MG PO TABS
10.0000 mg | ORAL_TABLET | Freq: Every day | ORAL | 2 refills | Status: AC
  Filled 2024-04-15: qty 90, 90d supply, fill #0
  Filled 2024-05-07 – 2024-07-28 (×3): qty 90, 90d supply, fill #1
  Filled 2024-11-16: qty 90, 90d supply, fill #2

## 2024-04-15 MED ORDER — TIRZEPATIDE 5 MG/0.5ML ~~LOC~~ SOAJ
5.0000 mg | SUBCUTANEOUS | 0 refills | Status: DC
  Filled 2024-04-15: qty 2, 28d supply, fill #0
  Filled 2024-05-07 – 2024-05-10 (×2): qty 2, 28d supply, fill #1
  Filled 2024-06-09: qty 2, 28d supply, fill #2

## 2024-04-15 NOTE — Progress Notes (Signed)
 Aaron Park - 33 y.o. male MRN [id number]  Date of birth: Jul 17, 1991  Subjective Chief Complaint  Patient presents with   Annual Exam    HPI Aaron Park is a 33 y.o. male here today for annual exam.   He has history of HTN and T2DM.  He had lumbar spine surgery due to cauda equina.  Recovering well from this.   A1c is up some at 6.6%.  Rybelsus  has not really been effective at this point.  Ran out of amlodipine .   He is doing PT for activiity.  He feels that diet is pretty good.   Non-smoker. No EtOH.   Review of Systems  Constitutional:  Negative for chills, fever, malaise/fatigue and weight loss.  HENT:  Negative for congestion, ear pain and sore throat.   Eyes:  Negative for blurred vision, double vision and pain.  Respiratory:  Negative for cough and shortness of breath.   Cardiovascular:  Negative for chest pain and palpitations.  Gastrointestinal:  Negative for abdominal pain, blood in stool, constipation, heartburn and nausea.  Genitourinary:  Negative for dysuria and urgency.  Musculoskeletal:  Negative for joint pain and myalgias.  Neurological:  Negative for dizziness and headaches.  Endo/Heme/Allergies:  Does not bruise/bleed easily.  Psychiatric/Behavioral:  Negative for depression. The patient is not nervous/anxious and does not have insomnia.     No Known Allergies  Past Medical History:  Diagnosis Date   Asthma    Hypertension    Migraine     Past Surgical History:  Procedure Laterality Date   NO PAST SURGERIES      Social History   Socioeconomic History   Marital status: Single    Spouse name: Not on file   Number of children: Not on file   Years of education: Not on file   Highest education level: Not on file  Occupational History   Not on file  Tobacco Use   Smoking status: Never   Smokeless tobacco: Never  Vaping Use   Vaping status: Never Used  Substance and Sexual Activity   Alcohol use: Never   Drug use: Never   Sexual  activity: Not on file  Other Topics Concern   Not on file  Social History Narrative   Not on file   Social Drivers of Health   Financial Resource Strain: Not on file  Food Insecurity: No Food Insecurity (04/15/2024)   Hunger Vital Sign    Worried About Running Out of Food in the Last Year: Never true    Ran Out of Food in the Last Year: Never true  Transportation Needs: No Transportation Needs (04/15/2024)   PRAPARE - Administrator, Civil Service (Medical): No    Lack of Transportation (Non-Medical): No  Physical Activity: Not on file  Stress: Not on file  Social Connections: Socially Isolated (04/15/2024)   Social Connection and Isolation Panel    Frequency of Communication with Friends and Family: Three times a week    Frequency of Social Gatherings with Friends and Family: Three times a week    Attends Religious Services: Never    Active Member of Clubs or Organizations: No    Attends Banker Meetings: Never    Marital Status: Never married    Family History  Problem Relation Age of Onset   Diabetes Father     Health Maintenance  Topic Date Due   OPHTHALMOLOGY EXAM  Never done   Pneumococcal Vaccine 34-77 Years old (1 of 2 -  PCV) Never done   Hepatitis B Vaccines (1 of 3 - 19+ 3-dose series) Never done   HPV VACCINES (1 - Risk 3-dose SCDM series) Never done   COVID-19 Vaccine (1 - 2024-25 season) Never done   Diabetic kidney evaluation - Urine ACR  09/27/2023   HIV Screening  06/17/2024 (Originally 12/06/2006)   INFLUENZA VACCINE  05/21/2024   HEMOGLOBIN A1C  10/15/2024   Diabetic kidney evaluation - eGFR measurement  01/26/2025   FOOT EXAM  04/15/2025   DTaP/Tdap/Td (2 - Td or Tdap) 01/22/2033   Hepatitis C Screening  Completed   Meningococcal B Vaccine  Aged Out      ----------------------------------------------------------------------------------------------------------------------------------------------------------------------------------------------------------------- Physical Exam BP (!) 146/87 (BP Location: Left Arm, Patient Position: Sitting, Cuff Size: Normal)   Pulse (!) 107   Resp 18   Ht 5' 10.47 (1.79 m)   Wt 241 lb 4 oz (109.4 kg)   SpO2 97%   BMI 34.16 kg/m   Physical Exam Constitutional:      General: He is not in acute distress. HENT:     Head: Normocephalic and atraumatic.     Right Ear: Tympanic membrane and external ear normal.     Left Ear: Tympanic membrane and external ear normal.   Eyes:     General: No scleral icterus.  Neck:     Thyroid: No thyromegaly.   Cardiovascular:     Rate and Rhythm: Normal rate and regular rhythm.     Heart sounds: Normal heart sounds.  Pulmonary:     Effort: Pulmonary effort is normal.     Breath sounds: Normal breath sounds.  Abdominal:     General: Bowel sounds are normal. There is no distension.     Palpations: Abdomen is soft.     Tenderness: There is no abdominal tenderness. There is no guarding.   Musculoskeletal:     Cervical back: Normal range of motion.  Lymphadenopathy:     Cervical: No cervical adenopathy.   Skin:    General: Skin is warm and dry.     Findings: No rash.   Neurological:     Mental Status: He is alert and oriented to person, place, and time.     Cranial Nerves: No cranial nerve deficit.     Motor: No abnormal muscle tone.   Psychiatric:        Mood and Affect: Mood normal.        Behavior: Behavior normal.     ------------------------------------------------------------------------------------------------------------------------------------------------------------------------------------------------------------------- Assessment and Plan  Type 2 diabetes mellitus without complication, without long-term current use of insulin   (HCC) Rybelsus  is not really effective at this time.  A1c increased.  Change to mounjaro 5mg /weekly  Essential hypertension Restart amlodipine   Well adult exam Well adult Orders Placed This Encounter  Procedures   CMP14+EGFR   CBC with Differential/Platelet   Lipid Panel With LDL/HDL Ratio   Urine Microalbumin w/creat. ratio   POCT HgB A1C  Screenings; Per lab orders Immunizations:  UTD Anticipatory guidance/Risk factor reduction:  Recommendations per AVS.    Meds ordered this encounter  Medications   amLODipine  (NORVASC ) 10 MG tablet    Sig: Take 1 tablet (10 mg total) by mouth daily.    Dispense:  90 tablet    Refill:  0    No follow-ups on file.

## 2024-04-15 NOTE — Assessment & Plan Note (Signed)
 Restart amlodipine

## 2024-04-15 NOTE — Assessment & Plan Note (Signed)
 Well adult Orders Placed This Encounter  Procedures   CMP14+EGFR   CBC with Differential/Platelet   Lipid Panel With LDL/HDL Ratio   Urine Microalbumin w/creat. ratio   POCT HgB A1C  Screenings; Per lab orders Immunizations:  UTD Anticipatory guidance/Risk factor reduction:  Recommendations per AVS.

## 2024-04-15 NOTE — Patient Instructions (Signed)
 Preventive Care 76-32 Years Old, Male Preventive care refers to lifestyle choices and visits with your health care provider that can promote health and wellness. Preventive care visits are also called wellness exams. What can I expect for my preventive care visit? Counseling During your preventive care visit, your health care provider may ask about your: Medical history, including: Past medical problems. Family medical history. Current health, including: Emotional well-being. Home life and relationship well-being. Sexual activity. Lifestyle, including: Alcohol, nicotine or tobacco, and drug use. Access to firearms. Diet, exercise, and sleep habits. Safety issues such as seatbelt and bike helmet use. Sunscreen use. Work and work Astronomer. Physical exam Your health care provider may check your: Height and weight. These may be used to calculate your BMI (body mass index). BMI is a measurement that tells if you are at a healthy weight. Waist circumference. This measures the distance around your waistline. This measurement also tells if you are at a healthy weight and may help predict your risk of certain diseases, such as type 2 diabetes and high blood pressure. Heart rate and blood pressure. Body temperature. Skin for abnormal spots. What immunizations do I need?  Vaccines are usually given at various ages, according to a schedule. Your health care provider will recommend vaccines for you based on your age, medical history, and lifestyle or other factors, such as travel or where you work. What tests do I need? Screening Your health care provider may recommend screening tests for certain conditions. This may include: Lipid and cholesterol levels. Diabetes screening. This is done by checking your blood sugar (glucose) after you have not eaten for a while (fasting). Hepatitis B test. Hepatitis C test. HIV (human immunodeficiency virus) test. STI (sexually transmitted infection)  testing, if you are at risk. Talk with your health care provider about your test results, treatment options, and if necessary, the need for more tests. Follow these instructions at home: Eating and drinking  Eat a healthy diet that includes fresh fruits and vegetables, whole grains, lean protein, and low-fat dairy products. Drink enough fluid to keep your urine pale yellow. Take vitamin and mineral supplements as recommended by your health care provider. Do not drink alcohol if your health care provider tells you not to drink. If you drink alcohol: Limit how much you have to 0-2 drinks a day. Know how much alcohol is in your drink. In the [place] one drink equals one 12 oz bottle of beer (355 mL), one 5 oz glass of wine (148 mL), or one 1 oz glass of hard liquor (44 mL). Lifestyle Brush your teeth every morning and night with fluoride toothpaste. Floss one time each day. Exercise for at least 30 minutes 5 or more days each week. Do not use any products that contain nicotine or tobacco. These products include cigarettes, chewing tobacco, and vaping devices, such as e-cigarettes. If you need help quitting, ask your health care provider. Do not use drugs. If you are sexually active, practice safe sex. Use a condom or other form of protection to prevent STIs. Find healthy ways to manage stress, such as: Meditation, yoga, or listening to music. Journaling. Talking to a trusted person. Spending time with friends and family. Minimize exposure to UV radiation to reduce your risk of skin cancer. Safety Always wear your seat belt while driving or riding in a vehicle. Do not drive: If you have been drinking alcohol. Do not ride with someone who has been drinking. If you have been using any mind-altering substances  or drugs. While texting. When you are tired or distracted. Wear a helmet and other protective equipment during sports activities. If you have firearms in your house, make sure you  follow all gun safety procedures. Seek help if you have been physically or sexually abused. What's next? Go to your health care provider once a year for an annual wellness visit. Ask your health care provider how often you should have your eyes and teeth checked. Stay up to date on all vaccines. This information is not intended to replace advice given to you by your health care provider. Make sure you discuss any questions you have with your health care provider. Document Revised: 04/04/2021 Document Reviewed: 04/04/2021 Elsevier Patient Education  2024 [employer].

## 2024-04-15 NOTE — Assessment & Plan Note (Signed)
 Rybelsus  is not really effective at this time.  A1c increased.  Change to mounjaro 5mg arvil

## 2024-04-16 ENCOUNTER — Other Ambulatory Visit (HOSPITAL_BASED_OUTPATIENT_CLINIC_OR_DEPARTMENT_OTHER): Payer: Self-pay

## 2024-04-16 ENCOUNTER — Other Ambulatory Visit (HOSPITAL_COMMUNITY): Payer: Self-pay

## 2024-04-16 ENCOUNTER — Telehealth: Payer: Self-pay

## 2024-04-16 NOTE — Telephone Encounter (Signed)
 Pharmacy Patient Advocate Encounter  Received notification from [employer]  that Prior Authorization for Mounjaro  5MG /0.5ML auto-injectors has been APPROVED from 04/16/24 to 04/16/25. Ran test claim, Copay is $0. This test claim was processed through [facility]- copay amounts may vary at other pharmacies due to pharmacy/plan contracts, or as the patient moves through the different stages of their insurance plan.   PA #/Case ID/Reference #: [id number]

## 2024-04-16 NOTE — Telephone Encounter (Signed)
 Pharmacy Patient Advocate Encounter   Received notification from CoverMyMeds that prior authorization for Mounjaro  5MG /0.5ML auto-injectors is required/requested.   Insurance verification completed.   The patient is insured through [place] .   Per test claim: PA required; PA submitted to above mentioned insurance via CoverMyMeds Key/confirmation #/EOC [facility] Status is pending

## 2024-04-19 ENCOUNTER — Other Ambulatory Visit (HOSPITAL_BASED_OUTPATIENT_CLINIC_OR_DEPARTMENT_OTHER): Payer: Self-pay

## 2024-04-29 ENCOUNTER — Ambulatory Visit

## 2024-05-08 ENCOUNTER — Other Ambulatory Visit (HOSPITAL_BASED_OUTPATIENT_CLINIC_OR_DEPARTMENT_OTHER): Payer: Self-pay

## 2024-06-22 ENCOUNTER — Encounter: Payer: Self-pay | Admitting: Sports Medicine

## 2024-07-06 ENCOUNTER — Other Ambulatory Visit: Payer: Self-pay | Admitting: Family Medicine

## 2024-07-06 ENCOUNTER — Other Ambulatory Visit (HOSPITAL_BASED_OUTPATIENT_CLINIC_OR_DEPARTMENT_OTHER): Payer: Self-pay

## 2024-07-06 DIAGNOSIS — E119 Type 2 diabetes mellitus without complications: Secondary | ICD-10-CM

## 2024-07-06 MED ORDER — MOUNJARO 5 MG/0.5ML ~~LOC~~ SOAJ
5.0000 mg | SUBCUTANEOUS | 0 refills | Status: DC
  Filled 2024-07-06: qty 2, 28d supply, fill #0

## 2024-07-12 ENCOUNTER — Other Ambulatory Visit (HOSPITAL_BASED_OUTPATIENT_CLINIC_OR_DEPARTMENT_OTHER): Payer: Self-pay

## 2024-07-12 ENCOUNTER — Encounter: Payer: Self-pay | Admitting: Family Medicine

## 2024-07-12 DIAGNOSIS — E119 Type 2 diabetes mellitus without complications: Secondary | ICD-10-CM

## 2024-07-12 MED ORDER — TIRZEPATIDE 7.5 MG/0.5ML ~~LOC~~ SOAJ
7.5000 mg | SUBCUTANEOUS | 0 refills | Status: DC
  Filled 2024-07-12: qty 6, 84d supply, fill #0
  Filled 2024-07-12: qty 2, 28d supply, fill #0
  Filled 2024-07-12 – 2024-07-13 (×2): qty 6, 84d supply, fill #0
  Filled 2024-07-13 – 2024-07-28 (×2): qty 2, 28d supply, fill #0
  Filled 2024-08-23: qty 2, 28d supply, fill #1
  Filled 2024-09-24: qty 2, 28d supply, fill #2

## 2024-07-13 ENCOUNTER — Telehealth: Payer: Self-pay

## 2024-07-13 ENCOUNTER — Other Ambulatory Visit (HOSPITAL_BASED_OUTPATIENT_CLINIC_OR_DEPARTMENT_OTHER): Payer: Self-pay

## 2024-07-13 ENCOUNTER — Other Ambulatory Visit: Payer: Self-pay

## 2024-07-13 ENCOUNTER — Other Ambulatory Visit (HOSPITAL_COMMUNITY): Payer: Self-pay

## 2024-07-13 NOTE — Telephone Encounter (Signed)
 Pharmacy Patient Advocate Encounter   Received notification from Physician's Office that prior authorization for Mounjaro  7.5MG /0.5ML auto-injectors  is required/requested.   Insurance verification completed.   The patient is insured through [employer] .   Per test claim: PA required; PA submitted to above mentioned insurance via Latent Key/confirmation #/[id number] Status is pending

## 2024-07-13 NOTE — Telephone Encounter (Signed)
 PA request has been Started. New Encounter has been or will be created for follow up. For additional info see Pharmacy Prior Auth telephone encounter from 07/13/2024.

## 2024-07-13 NOTE — Telephone Encounter (Signed)
 Please initiate PA for Mounjaro  7.5mg . Thank you

## 2024-07-16 ENCOUNTER — Telehealth: Payer: Self-pay

## 2024-07-16 NOTE — Telephone Encounter (Signed)
 Pharmacy Patient Advocate Encounter   Received notification from CoverMyMeds that prior authorization for Rybelsus  14MG  tablets is due for renewal.   Insurance verification completed.   The patient is insured through [employer].  Action: Medication has been discontinued. Archived Key: [id number]

## 2024-07-23 ENCOUNTER — Other Ambulatory Visit (HOSPITAL_BASED_OUTPATIENT_CLINIC_OR_DEPARTMENT_OTHER): Payer: Self-pay

## 2024-07-27 ENCOUNTER — Other Ambulatory Visit (HOSPITAL_COMMUNITY): Payer: Self-pay

## 2024-07-27 NOTE — Telephone Encounter (Signed)
 Per insurance. This medication does not require prior auth. I ran a test claim. $0 copay for Mounjaro  7.5mg /0.5 ml injectors.

## 2024-07-28 ENCOUNTER — Other Ambulatory Visit (HOSPITAL_BASED_OUTPATIENT_CLINIC_OR_DEPARTMENT_OTHER): Payer: Self-pay

## 2024-07-28 ENCOUNTER — Other Ambulatory Visit: Payer: Self-pay

## 2024-07-28 MED ORDER — KETOCONAZOLE 2 % EX CREA
1.0000 | TOPICAL_CREAM | Freq: Two times a day (BID) | CUTANEOUS | 4 refills | Status: AC
  Filled 2024-07-28: qty 60, 30d supply, fill #0

## 2024-08-23 ENCOUNTER — Other Ambulatory Visit: Payer: Self-pay

## 2024-08-23 ENCOUNTER — Other Ambulatory Visit (HOSPITAL_BASED_OUTPATIENT_CLINIC_OR_DEPARTMENT_OTHER): Payer: Self-pay

## 2024-09-27 ENCOUNTER — Other Ambulatory Visit (HOSPITAL_BASED_OUTPATIENT_CLINIC_OR_DEPARTMENT_OTHER): Payer: Self-pay

## 2024-11-16 ENCOUNTER — Other Ambulatory Visit: Payer: Self-pay

## 2024-11-16 ENCOUNTER — Other Ambulatory Visit: Payer: Self-pay | Admitting: Family Medicine

## 2024-11-16 ENCOUNTER — Other Ambulatory Visit (HOSPITAL_BASED_OUTPATIENT_CLINIC_OR_DEPARTMENT_OTHER): Payer: Self-pay

## 2024-11-16 NOTE — Telephone Encounter (Signed)
 Requesting rx rf of Mounjaro  7.5mg   Last written 07/12/2024 Last OV 04/15/2024 Upcoming appt none

## 2024-11-18 ENCOUNTER — Other Ambulatory Visit (HOSPITAL_BASED_OUTPATIENT_CLINIC_OR_DEPARTMENT_OTHER): Payer: Self-pay

## 2024-11-18 MED ORDER — MOUNJARO 7.5 MG/0.5ML ~~LOC~~ SOAJ
7.5000 mg | SUBCUTANEOUS | 0 refills | Status: AC
  Filled 2024-11-18: qty 2, 28d supply, fill #0

## 2024-11-24 NOTE — Telephone Encounter (Signed)
 Last read by Mabel Mems at 11:36AM on 11/19/2024.
